# Patient Record
Sex: Female | Born: 1995 | Race: Black or African American | Hispanic: No | Marital: Single | Smoking: Former smoker
Health system: Southern US, Community
[De-identification: ages and names within clinical notes are randomized; demographics above are authoritative.]

## PROBLEM LIST (undated history)

## (undated) DIAGNOSIS — F32A Depression, unspecified: Secondary | ICD-10-CM

## (undated) DIAGNOSIS — F419 Anxiety disorder, unspecified: Secondary | ICD-10-CM

## (undated) DIAGNOSIS — F319 Bipolar disorder, unspecified: Secondary | ICD-10-CM

## (undated) DIAGNOSIS — E282 Polycystic ovarian syndrome: Secondary | ICD-10-CM

## (undated) DIAGNOSIS — F329 Major depressive disorder, single episode, unspecified: Secondary | ICD-10-CM

## (undated) DIAGNOSIS — L02419 Cutaneous abscess of limb, unspecified: Secondary | ICD-10-CM

---
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
## 2015-03-07 ENCOUNTER — Encounter (HOSPITAL_COMMUNITY): Payer: Self-pay

## 2015-03-07 ENCOUNTER — Emergency Department (HOSPITAL_COMMUNITY)
Admission: EM | Admit: 2015-03-07 | Discharge: 2015-03-07 | Disposition: A | Discharge status: Home / Self Care | Payer: Managed Care, Other (non HMO) | Attending: Emergency Medicine | Admitting: Emergency Medicine

## 2015-03-07 DIAGNOSIS — Z792 Long term (current) use of antibiotics: Secondary | ICD-10-CM | POA: Insufficient documentation

## 2015-03-07 DIAGNOSIS — J069 Acute upper respiratory infection, unspecified: Secondary | ICD-10-CM | POA: Diagnosis not present

## 2015-03-07 DIAGNOSIS — R0981 Nasal congestion: Secondary | ICD-10-CM | POA: Diagnosis present

## 2015-03-07 MED ORDER — IPRATROPIUM-ALBUTEROL 0.5-2.5 (3) MG/3ML IN SOLN
3.0000 mL | Freq: Once | RESPIRATORY_TRACT | Status: AC
  Administered 2015-03-07: 3 mL via RESPIRATORY_TRACT
  Filled 2015-03-07: qty 3

## 2015-03-07 MED ORDER — ALBUTEROL SULFATE HFA 108 (90 BASE) MCG/ACT IN AERS: 1.0000 | INHALATION_SPRAY | Freq: Four times a day (QID) | RESPIRATORY_TRACT | Status: DC | PRN

## 2015-03-07 NOTE — ED Notes (Signed)
Patient here with cough, congestion, runny nose x 1 week. No distress

## 2015-03-07 NOTE — ED Provider Notes (Signed)
CSN: [id number]     Arrival date & time 03/07/15  1411 History  By signing my name below, I, Ronney Lion, attest that this documentation has been prepared under the direction and in the presence of [facility], PA-C. Electronically Signed: Ronney Lion, ED Scribe. 03/07/2015. 2:54 PM.    Chief Complaint  Patient presents with  . Cough  . Nasal Congestion   The history is provided by the patient. No language interpreter was used.   HPI Comments: Veronica Moyer is a 20 y.o. female who presents to the Emergency Department with multiple URI-like complaints, including a constant, moderate, worsening cough, congestion, and rhinorrhea that began 5 days ago. She also notes associated voice hoarseness and subjective fever. She denies a history of asthma or any other pulmonary conditions. Patient had taken OTC flu medication with no relief.   Past Medical History  Diagnosis Date  . Medical history non-contributory    Past Surgical History  Procedure Laterality Date  . No past surgeries     No family history on file. Social History  Substance Use Topics  . Smoking status: Never Smoker   . Smokeless tobacco: None  . Alcohol Use: No   OB History    Gravida Para Term Preterm AB TAB SAB Ectopic Multiple Living   1    1          Review of Systems  Constitutional: Positive for fever (subjective).  HENT: Positive for congestion, rhinorrhea and voice change.   Respiratory: Positive for cough.    Allergies  Review of patient's allergies indicates no known allergies.  Home Medications   Prior to Admission medications   Medication Sig Start Date End Date Taking? Authorizing Provider  albuterol (PROVENTIL HFA;VENTOLIN HFA) 108 (90 Base) MCG/ACT inhaler Inhale 1-2 puffs into the lungs every 6 (six) hours as needed for wheezing or shortness of breath. 03/07/15   Chase Picket Ward, PA-C  cephALEXin (KEFLEX) 500 MG capsule Take 1 capsule (500 mg total) by mouth 4 (four) times daily. 11/16/14   Felicie Morn, NP  HYDROcodone-acetaminophen (NORCO/VICODIN) 5-325 MG tablet Take 1 tablet by mouth every 6 (six) hours as needed for severe pain. 11/16/14   Felicie Morn, NP  ibuprofen (ADVIL,MOTRIN) 400 MG tablet Take 400 mg by mouth every 6 (six) hours as needed (pain).     Historical Provider, MD   BP 126/61 mmHg  Pulse 103  Temp(Src) 98.8 F (37.1 C) (Oral)  Resp 18  Ht  (1.549 m)  Wt 104.327 kg  BMI 43.48 kg/m2  SpO2 98%  LMP 12/01/2014 Physical Exam  Constitutional: She is oriented to person, place, and time. She appears well-developed and well-nourished. No distress.  Speaking in full sentences.   HENT:  Head: Normocephalic and atraumatic.  Mouth/Throat: Oropharynx is clear and moist. No oropharyngeal exudate.  Neck: Normal range of motion. Neck supple. No tracheal deviation present.  Cardiovascular: Normal rate, regular rhythm and normal heart sounds.   No murmur heard. Pulmonary/Chest: Effort normal. No respiratory distress. She has wheezes (Diffuse expiratory).  Abdominal: Soft. She exhibits no distension. There is no tenderness.  Musculoskeletal: Normal range of motion. She exhibits no edema.  Lymphadenopathy:    She has no cervical adenopathy.  Neurological: She is alert and oriented to person, place, and time.  Skin: Skin is warm and dry. No rash noted.  Psychiatric: She has a normal mood and affect. Her behavior is normal.  Nursing note and vitals reviewed.   ED Course  Procedures (including critical care time)  DIAGNOSTIC STUDIES: Oxygen Saturation is 98% on RA, normal by my interpretation.    COORDINATION OF CARE: 2:49 PM - Discussed treatment plan with pt at bedside which includes nebulizer treatment administered here, followed by re-check. Pt verbalized understanding and agreed to plan.   Imaging Review No results found. I have personally reviewed and evaluated these images and lab results as part of my medical decision-making.  MDM   Final diagnoses:   URI (upper respiratory infection)   Veronica Moyer is afebrile, non-toxic appearing with faint expiratory wheezing on exam. No crackles. Duoneb given in ED - patient states noticeable improvement and repeat lung exam with no wheezing. Will give albuterol inhaler. Patient is agreeable to symptomatic treatment for URI symptoms with  close follow up with PCP as needed (resource guide given).  spoke at length about emergent, changing, or worsening of symptoms that should prompt return to ER. Patient voices understanding and is agreeable to plan.   Blood pressure 126/61, pulse 103, temperature 98.8 F (37.1 C), temperature source Oral, resp. rate 18, height  (1.549 m), weight 104.327 kg, last menstrual period 12/01/2014, SpO2 98 %.  I personally performed the services described in this documentation, which was scribed in my presence. The recorded information has been reviewed and is accurate.    [facility] Ward, PA-C 03/07/15 1625  Benjiman Core, MD 03/08/15 [phone]

## 2015-03-07 NOTE — ED Notes (Signed)
Declined W/C at D/C and was escorted to lobby by RN. 

## 2015-03-07 NOTE — Discharge Instructions (Signed)
1. Medications: flonase as needed for nasal congestion, inhaler as needed for shortness of breath 2. Treatment: rest, drink plenty of fluids, take tylenol or ibuprofen for fever control if needed 3. Follow Up: Please follow up with your primary doctor in 3 days for discussion of your diagnoses and further evaluation after today's visit;  if you do not have a primary care doctor use the resource guide provided to find one; Return to the ER for high fevers, difficulty breathing or other concerning symptoms   Emergency Department Resource Guide  1) Find a Doctor and Pay Out of Pocket Although you won't have to find out who is covered by your insurance plan, it is a good idea to ask around and get recommendations. You will then need to call the office and see if the doctor you have chosen will accept you as a new patient and what types of options they offer for patients who are self-pay. Some doctors offer discounts or will set up payment plans for their patients who do not have insurance, but you will need to ask so you aren't surprised when you get to your appointment.  2) Contact Your Local Health Department Not all health departments have doctors that can see patients for sick visits, but many do, so it is worth a call to see if yours does. If you don't know where your local health department is, you can check in your phone book. The CDC also has a tool to help you locate your state's health department, and many state websites also have listings of all of their local health departments.  3) Find a Walk-in Clinic If your illness is not likely to be very severe or complicated, you may want to try a walk in clinic. These are popping up all over the country in pharmacies, drugstores, and shopping centers. They're usually staffed by nurse practitioners or physician assistants that have been trained to treat common illnesses and complaints. They're usually fairly quick and inexpensive. However, if you have  serious medical issues or chronic medical problems, these are probably not your best option.  No Primary Care Doctor: - Call Health Connect at  [phone]: they can help you locate a primary care doctor that  accepts your insurance, provides certain services, etc. - Physician Referral Service: [phone]  Chronic Pain Problems: Organization         Address  Phone   Notes  [facility]  [phone] Patients need to be referred by their primary care doctor.   Medication Assistance: Organization         Address  Phone   Notes  [facility] Medication [facility] [address] [place], [place] [address] [phone] --Must be a resident of [facility] -- Must have NO insurance coverage whatsoever (no Medicaid/ Medicare, etc.) -- The pt. MUST have a primary care doctor that directs their care regularly and follows them in the community   [facility]  [phone]   [employer]  [phone]    Agencies that provide inexpensive medical care: Organization         Address  Phone   Notes  [facility]  [phone]   [facility]    [phone]   [facility] [address] [place], [place] [address] [phone]   [facility] [address] [place] [phone]   [facility]    [phone]  [facility]    [phone]   [facility]  [address], [place] Phone:  [phone], Fax:  [phone] Hours of Operation:  9 am - 6 pm, M-F.  Also accepts Medicaid/Medicare and self-pay.  [facility]  [address], [place] Phone: [phone], Fax: [phone]. Hours of Operation:  8:30 am - 5:30 pm, M-F.  Also accepts Medicaid and self-pay.  [facility] [place] [address] [place] Phone: [phone]   [facility] [address] Natasha Bence [place], [place] [phone], Ext. [phone] Mondays & Thursdays: 7-9 AM.  First 15 patients are seen on a first come, first serve basis.    Medicaid-accepting [facility] Providers:  Organization         Address  Phone   Notes  [facility] [address], [place] [phone] Also accepts self-pay patients.  [facility] [address] Laurell Josephs [place], [place]  [phone]   [facility] [address], [place] [phone]   [facility] [address] [place] [phone]   Renaye Rakers [address], [place]   [phone] Only accepts [place] patients after they have their name applied to their card.   Self-Pay (no insurance) in [facility]:  Organization         Address  Phone   Notes  Sickle Cell Patients, [facility] [address] [place], [place] [phone]   [facility] [address] [place], [place] [phone]   [facility]  [address], [place] [phone]   [facility]/Dr. Osei-Bonsu  [address] [place] or [address], [place] [phone] Phone number for both [place] and [place] locations is the same.  [facility] and [facility] [address], [place] [phone]   [facility] [address], [place] or [address] [phone]   [facility] [address] [place] [phone], phone; [phone], fax Sees patients 1st and 3rd Saturday of every month.  Must not qualify for public or private insurance (i.e. Medicaid, Medicare, [place] Health Choice, Veterans' Benefits)  Household income should be no more than 200% of the poverty level The clinic cannot treat you if you are pregnant or think you are pregnant   Sexually transmitted diseases are not treated at the clinic.

## 2015-06-23 ENCOUNTER — Encounter (HOSPITAL_COMMUNITY): Payer: Self-pay | Admitting: Emergency Medicine

## 2015-06-23 ENCOUNTER — Ambulatory Visit (HOSPITAL_COMMUNITY)
Admission: EM | Admit: 2015-06-23 | Discharge: 2015-06-23 | Disposition: A | Discharge status: Home / Self Care | Payer: Managed Care, Other (non HMO) | Attending: Emergency Medicine | Admitting: Emergency Medicine

## 2015-06-23 DIAGNOSIS — H109 Unspecified conjunctivitis: Secondary | ICD-10-CM

## 2015-06-23 DIAGNOSIS — L732 Hidradenitis suppurativa: Secondary | ICD-10-CM

## 2015-06-23 MED ORDER — CLINDAMYCIN HCL 300 MG PO CAPS: 300.0000 mg | ORAL_CAPSULE | Freq: Four times a day (QID) | ORAL | Status: DC

## 2015-06-23 MED ORDER — POLYMYXIN B-TRIMETHOPRIM 10000-0.1 UNIT/ML-% OP SOLN: 2.0000 [drp] | OPHTHALMIC | Status: DC

## 2015-06-23 NOTE — ED Notes (Signed)
Pt here for left pink eye onset this am Also reports abscess on left flank and would like antibiotics  A&O x4... No acute distress.

## 2015-06-23 NOTE — Discharge Instructions (Signed)
Hidradenitis Suppurativa Hidradenitis suppurativa is a long-term (chronic) skin disease that starts with blocked sweat glands or hair follicles. Bacteria may grow in these blocked openings of your skin. Hidradenitis suppurativa is like a severe form of acne that develops in areas of your body where acne would be unusual. It is most likely to affect the areas of your body where skin rubs against skin and becomes moist. This includes your:  Underarms.  Groin.  Genital areas.  Buttocks.  Upper thighs.  Breasts. Hidradenitis suppurativa may start out with small pimples. The pimples can develop into deep sores that break open (rupture) and drain pus. Over time your skin may thicken and become scarred. Hidradenitis suppurativa cannot be passed from person to person.  CAUSES  The exact cause of hidradenitis suppurativa is not known. This condition may be due to:  Female and female hormones. The condition is rare before and after puberty.  An overactive body defense system (immune system). Your immune system may overreact to the blocked hair follicles or sweat glands and cause swelling and pus-filled sores. RISK FACTORS You may have a higher risk of hidradenitis suppurativa if you:  Are a woman.  Are between ages 11 and 9.  Have a family history of hidradenitis suppurativa.  Have a personal history of acne.  Are overweight.  Smoke.  Take the drug lithium. SIGNS AND SYMPTOMS  The first signs of an outbreak are usually painful skin bumps that look like pimples. As the condition progresses:  Skin bumps may get bigger and grow deeper into the skin.  Bumps under the skin may rupture and drain smelly pus.  Skin may become itchy and infected.  Skin may thicken and scar.  Drainage may continue through tunnels under the skin (fistulas).  Walking and moving your arms can become painful. DIAGNOSIS  Your health care provider may diagnose hidradenitis suppurativa based on your medical  history and your signs and symptoms. A physical exam will also be done. You may need to see a health care provider who specializes in skin diseases (dermatologist). You may also have tests done to confirm the diagnosis. These can include:  Swabbing a sample of pus or drainage from your skin so it can be sent to the lab and tested for infection.  Blood tests to check for infection. TREATMENT  The same treatment will not work for everybody with hidradenitis suppurativa. Your treatment will depend on how severe your symptoms are. You may need to try several treatments to find what works best for you. Part of your treatment may include cleaning and bandaging (dressing) your wounds. You may also have to take medicines, such as the following:  Antibiotics.  Acne medicines.  Medicines to block or suppress the immune system.  A diabetes medicine (metformin) is sometimes used to treat this condition.  For women, birth control pills can sometimes help relieve symptoms. You may need surgery if you have a severe case of hidradenitis suppurativa that does not respond to medicine. Surgery may involve:   Using a laser to clear the skin and remove hair follicles.  Opening and draining deep sores.  Removing the areas of skin that are diseased and scarred. HOME CARE INSTRUCTIONS  Learn as much as you can about your disease, and work closely with your health care providers.  Take medicines only as directed by your health care provider.  If you were prescribed an antibiotic medicine, finish it all even if you start to feel better.  If you are  overweight, losing weight may be very helpful. Try to reach and maintain a healthy weight.  Do not use any tobacco products, including cigarettes, chewing tobacco, or electronic cigarettes. If you need help quitting, ask your health care provider.  Do not shave the areas where you get hidradenitis suppurativa.  Do not wear deodorant.  Wear loose-fitting  clothes.  Try not to overheat and get sweaty.  Take a daily bleach bath as directed by your health care provider.  Fill your bathtub halfway with water.  Pour in  cup of unscented household bleach.  Soak for 5-10 minutes.  Cover sore areas with a warm, clean washcloth (compress) for 5-10 minutes. SEEK MEDICAL CARE IF:   You have a flare-up of hidradenitis suppurativa.  You have chills or a fever.  You are having trouble controlling your symptoms at home.   This information is not intended to replace advice given to you by your health care provider. Make sure you discuss any questions you have with your health care provider.   Document Released: 08/16/2003 Document Revised: 01/22/2014 Document Reviewed: 04/03/2013 Elsevier Interactive Patient Education 2016 Elsevier Inc.  Bacterial Conjunctivitis Bacterial conjunctivitis (commonly called pink eye) is redness, soreness, or puffiness (inflammation) of the white part of your eye. It is caused by a germ called bacteria. These germs can easily spread from person to person (contagious). Your eye often will become red or pink. Your eye may also become irritated, watery, or have a thick discharge.  HOME CARE   Apply a cool, clean washcloth over closed eyelids. Do this for 10-20 minutes, 3-4 times a day while you have pain.  Gently wipe away any fluid coming from the eye with a warm, wet washcloth or cotton ball.  Wash your hands often with soap and water. Use paper towels to dry your hands.  Do not share towels or washcloths.  Change or wash your pillowcase every day.  Do not use eye makeup until the infection is gone.  Do not use machines or drive if your vision is blurry.  Stop using contact lenses. Do not use them again until your doctor says it is okay.  Do not touch the tip of the eye drop bottle or medicine tube with your fingers when you put medicine on the eye. GET HELP RIGHT AWAY IF:   Your eye is not better after 3  days of starting your medicine.  You have a yellowish fluid coming out of the eye.  You have more pain in the eye.  Your eye redness is spreading.  Your vision becomes blurry.  You have a fever or lasting symptoms for more than 2-3 days.  You have a fever and your symptoms suddenly get worse.  You have pain in the face.  Your face gets red or puffy (swollen). MAKE SURE YOU:   Understand these instructions.  Will watch this condition.  Will get help right away if you are not doing well or get worse.   This information is not intended to replace advice given to you by your health care provider. Make sure you discuss any questions you have with your health care provider.   Document Released: 10/11/2007 Document Revised: 12/19/2011 Document Reviewed: 09/07/2011 Elsevier Interactive Patient Education [employer] Elsevier Inc.

## 2015-06-23 NOTE — ED Provider Notes (Signed)
CSN: [id number]     Arrival date & time 06/23/15  1302 History   First MD Initiated Contact with Patient 06/23/15 1335     Chief Complaint  Patient presents with  . Conjunctivitis  . 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 She has a history of hydradenitis and that she has a flare at this time. She states that in the past her abscesses will usually self rupture on occasion she has had to have incision and drainage Other symptoms include:   Pain score: 3 FAMILY HISTORY: Family history of hypertension    Past Medical History  Diagnosis Date  . Medical history non-contributory    Past Surgical History  Procedure Laterality Date  . No past surgeries     No family history on file. Social History  Substance Use Topics  . Smoking status: Never Smoker   . Smokeless tobacco: None  . Alcohol Use: No   OB History    Gravida Para Term Preterm AB TAB SAB Ectopic Multiple Living   1    1          Review of Systems  Denies: HEADACHE, NAUSEA, ABDOMINAL PAIN, CHEST PAIN, CONGESTION, DYSURIA, SHORTNESS OF BREATH  Allergies  Review of patient's allergies indicates no known allergies.  Home Medications   Prior to Admission medications   Medication Sig Start Date End Date Taking? Authorizing Provider  albuterol (PROVENTIL HFA;VENTOLIN HFA) 108 (90 Base) MCG/ACT inhaler Inhale 1-2 puffs into the lungs every 6 (six) hours as needed for wheezing or shortness of breath. 03/07/15   Chase Picket Ward, PA-C  cephALEXin (KEFLEX) 500 MG capsule Take 1 capsule (500 mg total) by mouth 4 (four) times  daily. 11/16/14   Felicie Morn, NP  clindamycin (CLEOCIN) 300 MG capsule Take 1 capsule (300 mg total) by mouth every 6 (six) hours. 06/23/15   Tharon Aquas, PA  HYDROcodone-acetaminophen (NORCO/VICODIN) 5-325 MG tablet Take 1 tablet by mouth every 6 (six) hours as needed for severe pain. 11/16/14   Felicie Morn, NP  ibuprofen (ADVIL,MOTRIN) 400 MG tablet Take 400 mg by mouth every 6 (six) hours as needed (pain).     Historical Provider, MD  trimethoprim-polymyxin b (POLYTRIM) ophthalmic solution Place 2 drops into the left eye every 4 (four) hours. 06/23/15   Tharon Aquas, PA   Meds Ordered and Administered this Visit  Medications - No data to display  BP 122/88 mmHg  Pulse 104  Temp(Src) 98.1 F (36.7 C) (Oral)  Resp 18  SpO2 96% No data found.   Physical Exam NURSES NOTES AND VITAL SIGNS REVIEWED. CONSTITUTIONAL: Well developed, well nourished, no acute distress HEENT: normocephalic, atraumatic EYES:  RightConjunctiva normal, Left conjunctiva injected with follicular swelling.  NECK:normal ROM, supple, no adenopathy PULMONARY:No respiratory distress, normal effort ABDOMINAL: Soft, ND, NT BS+, No CVAT, there is a tender 2 cm fluctuant mass lesion left side. No cellulitis.  MUSCULOSKELETAL: Normal ROM of all extremities,  SKIN: warm and dry without rash PSYCHIATRIC: Mood and affect, behavior are normal  ED Course  Procedures (including critical care time)  Labs Review Labs Reviewed - No data to display  Imaging Review No results  found.   Visual Acuity Review  Right Eye Distance:   Left Eye Distance:   Bilateral Distance:    Right Eye Near:   Left Eye Near:    Bilateral Near:    Discussed treatment of abscess 4 with incision and drainage patient is declining states that she would prefer just having antibiotics rx for polytrim, and clindamycin HS will get better but is a chronic problem so most likely will come back in the future, BC is a self limited illness and full  recovery is prognosed.   MDM   1. Hidradenitis suppurativa   2. Conjunctivitis of left eye     Patient is reassured that there are no issues that require transfer to higher level of care at this time or additional tests. Patient is advised to continue home symptomatic treatment. Patient is advised that if there are new or worsening symptoms to attend the emergency department, contact primary care provider, or return to UC. Instructions of care provided discharged home in stable condition.    THIS NOTE WAS GENERATED USING A VOICE RECOGNITION SOFTWARE PROGRAM. ALL REASONABLE EFFORTS  WERE MADE TO PROOFREAD THIS DOCUMENT FOR ACCURACY.  I have verbally reviewed the discharge instructions with the patient. A printed AVS was given to the patient.  All questions were answered prior to discharge.      Tharon AquasFrank C Fawn Desrocher, PA 06/23/15 1407

## 2015-06-23 NOTE — ED Notes (Signed)
Pt d/c by frank p, pa 

## 2015-12-05 ENCOUNTER — Emergency Department (HOSPITAL_COMMUNITY): Payer: Managed Care, Other (non HMO)

## 2015-12-05 ENCOUNTER — Encounter (HOSPITAL_COMMUNITY): Payer: Self-pay | Admitting: Vascular Surgery

## 2015-12-05 ENCOUNTER — Emergency Department (HOSPITAL_COMMUNITY)
Admission: EM | Admit: 2015-12-05 | Discharge: 2015-12-05 | Disposition: A | Discharge status: Home / Self Care | Payer: Managed Care, Other (non HMO) | Attending: Emergency Medicine | Admitting: Emergency Medicine

## 2015-12-05 DIAGNOSIS — R05 Cough: Secondary | ICD-10-CM | POA: Diagnosis present

## 2015-12-05 DIAGNOSIS — B9789 Other viral agents as the cause of diseases classified elsewhere: Secondary | ICD-10-CM

## 2015-12-05 DIAGNOSIS — J069 Acute upper respiratory infection, unspecified: Secondary | ICD-10-CM | POA: Insufficient documentation

## 2015-12-05 MED ORDER — BENZONATATE 100 MG PO CAPS: 200.0000 mg | ORAL_CAPSULE | Freq: Two times a day (BID) | ORAL | 0 refills | Status: DC | PRN

## 2015-12-05 MED ORDER — OXYMETAZOLINE HCL 0.05 % NA SOLN: 1.0000 | Freq: Two times a day (BID) | NASAL | 0 refills | Status: DC

## 2015-12-05 NOTE — ED Notes (Signed)
D/C papers reviewed and patient verbalizes understanding   

## 2015-12-05 NOTE — Discharge Instructions (Signed)
Take your medications as prescribed. I also recommend continuing to drink fluids at home to remain hydrated. You may also use a cool mist humidifier at home to help with her cough. Please follow up with a primary care provider from the Resource Guide provided below in one week if your symptoms are not improved. Please return to the Emergency Department if symptoms worsen or new onset of fever, headache, neck stiffness, coughing up blood, wheezing, difficulty breathing, chest pain, abdominal pain, vomiting, unable to keep fluids down.

## 2015-12-05 NOTE — ED Provider Notes (Signed)
[facility]PT Provider Note   CSN: [id number] Arrival date & time: 12/05/15  1314  By signing my name below, I, Emmanuella Mensah, attest that this documentation has been prepared under the direction and in the presence of Melburn Hake, PA-C. Electronically Signed: Angelene Giovanni, ED Scribe. 12/05/15. 1:48 PM.   History   Chief Complaint Chief Complaint  Patient presents with  . Cough    HPI Comments: Veronica Moyer is a 20 y.o. female who presents to the Emergency Department complaining of gradually worsening persistent moderate productive cough with cloudy green/gray sputum onset 3 days ago. She notes that she had one episode of small amount of bright red streaky bloody sputum with her cough 2 days ago. She reports associated chills, nasal congestion, ear drainage, and intermittent episodes of shortness of breath which have since improved. She adds that she has been experiencing chest wall tenderness after coughing as well. No alleviating factors noted. Pt states that she has tried OTC medications PTA with no relief. She states that she worked with someone who had pneumonia several days prior to onset of her symptoms. Pt works at a [employer]. Pt has not received her flu vaccine this season. She also denies a hx of asthma. She denies any fever, sore throat, wheezing, vomiting, or any other symptoms.   The history is provided by the patient. No language interpreter was used.    Past Medical History:  Diagnosis Date  . Medical history non-contributory     There are no active problems to display for this patient.   Past Surgical History:  Procedure Laterality Date  . NO PAST SURGERIES      OB History    Gravida Para Term Preterm AB Living   1       1     SAB TAB Ectopic Multiple Live Births                   Home Medications    Prior to Admission medications   Medication Sig Start Date End Date Taking? Authorizing Provider  albuterol (PROVENTIL  HFA;VENTOLIN HFA) 108 (90 Base) MCG/ACT inhaler Inhale 1-2 puffs into the lungs every 6 (six) hours as needed for wheezing or shortness of breath. 03/07/15   Chase Picket Ward, PA-C  benzonatate (TESSALON) 100 MG capsule Take 2 capsules (200 mg total) by mouth 2 (two) times daily as needed for cough. 12/05/15   Barrett Henle, PA-C  cephALEXin (KEFLEX) 500 MG capsule Take 1 capsule (500 mg total) by mouth 4 (four) times daily. 11/16/14   Felicie Morn, NP  clindamycin (CLEOCIN) 300 MG capsule Take 1 capsule (300 mg total) by mouth every 6 (six) hours. 06/23/15   Tharon Aquas, PA  HYDROcodone-acetaminophen (NORCO/VICODIN) 5-325 MG tablet Take 1 tablet by mouth every 6 (six) hours as needed for severe pain. 11/16/14   Felicie Morn, NP  ibuprofen (ADVIL,MOTRIN) 400 MG tablet Take 400 mg by mouth every 6 (six) hours as needed (pain).     Historical Provider, MD  oxymetazoline (AFRIN NASAL SPRAY) 0.05 % nasal spray Place 1 spray into both nostrils 2 (two) times daily. Spray once into each nostril twice daily for up to the next 3 days. Do not use for more than 3 days to prevent rebound rhinorrhea. 12/05/15   Barrett Henle, PA-C  trimethoprim-polymyxin b (POLYTRIM) ophthalmic solution Place 2 drops into the left eye every 4 (four) hours. 06/23/15   Tharon Aquas, PA    Family  History History reviewed. No pertinent family history.  Social History Social History  Substance Use Topics  . Smoking status: Never Smoker  . Smokeless tobacco: Never Used  . Alcohol use No     Allergies   Patient has no known allergies.   Review of Systems Review of Systems  Constitutional: Positive for chills. Negative for fever.  HENT: Positive for congestion and ear discharge. Negative for sore throat.   Respiratory: Positive for cough and shortness of breath. Negative for wheezing.   Gastrointestinal: Negative for vomiting.     Physical Exam Updated Vital Signs BP 143/92 (BP Location: Left Arm)    Pulse 103   Temp 98.4 F (36.9 C) (Oral)   Resp 18   SpO2 100%   Physical Exam  Constitutional: She is oriented to person, place, and time. She appears well-developed and well-nourished.  HENT:  Head: Normocephalic and atraumatic.  Right Ear: A middle ear effusion is present.  Left Ear: A middle ear effusion is present.  Nose: Rhinorrhea present.  Mouth/Throat: Uvula is midline, oropharynx is clear and moist and mucous membranes are normal. No oropharyngeal exudate, posterior oropharyngeal edema, posterior oropharyngeal erythema or tonsillar abscesses. No tonsillar exudate.  Eyes: Conjunctivae and EOM are normal. Right eye exhibits no discharge. Left eye exhibits no discharge. No scleral icterus.  Neck: Normal range of motion. Neck supple.  Cardiovascular: Normal rate, regular rhythm, normal heart sounds and intact distal pulses.   HR 92  Pulmonary/Chest: Effort normal and breath sounds normal. No respiratory distress. She has no wheezes. She has no rales. She exhibits no tenderness.  Abdominal: Soft. Bowel sounds are normal. There is no tenderness.  Musculoskeletal: Normal range of motion. She exhibits no edema.  Lymphadenopathy:    She has no cervical adenopathy.  Neurological: She is alert and oriented to person, place, and time.  Skin: Skin is warm and dry.  Nursing note and vitals reviewed.    ED Treatments / Results  DIAGNOSTIC STUDIES: Oxygen Saturation is 100% on RA, normal by my interpretation.    COORDINATION OF CARE: 1:46 PM- Pt advised of plan for treatment and pt agrees. Pt will receive chest x-ray for further evaluation.    Labs (all labs ordered are listed, but only abnormal results are displayed) Labs Reviewed - No data to display  EKG  EKG Interpretation None       Radiology Dg Chest 2 View  Result Date: 12/05/2015 CLINICAL DATA:  Cough for 2 days. EXAM: CHEST  2 VIEW COMPARISON:  None. FINDINGS: The heart size and mediastinal contours are within  normal limits. Both lungs are clear. The visualized skeletal structures are unremarkable. IMPRESSION: Normal chest. Electronically Signed   By: Francene BoyersJames  Maxwell M.D.   On: 12/05/2015 14:40    Procedures Procedures (including critical care time)  Medications Ordered in ED Medications - No data to display   Initial Impression / Assessment and Plan / ED Course  Melburn HakeNicole Nikea Settle, PA-C has reviewed the triage vital signs and the nursing notes.  Pertinent labs & imaging results that were available during my care of the patient were reviewed by me and considered in my medical decision making (see chart for details).  Clinical Course     Pt CXR negative for acute infiltrate. Patients symptoms are consistent with URI, likely viral etiology. Discussed that antibiotics are not indicated for viral infections. Pt will be discharged with symptomatic treatment.  Verbalizes understanding and is agreeable with plan. Pt is hemodynamically stable & in NAD  prior to dc. Discussed return precautions.  Final Clinical Impressions(s) / ED Diagnoses   Final diagnoses:  Viral URI with cough    New Prescriptions Current Discharge Medication List    START taking these medications   Details  benzonatate (TESSALON) 100 MG capsule Take 2 capsules (200 mg total) by mouth 2 (two) times daily as needed for cough. Qty: 20 capsule, Refills: 0    oxymetazoline (AFRIN NASAL SPRAY) 0.05 % nasal spray Place 1 spray into both nostrils 2 (two) times daily. Spray once into each nostril twice daily for up to the next 3 days. Do not use for more than 3 days to prevent rebound rhinorrhea. Qty: 30 mL, Refills: 0       I personally performed the services described in this documentation, which was scribed in my presence. The recorded information has been reviewed and is accurate.    Satira Sarkicole Elizabeth LedyardNadeau, [place] JerseyPA-C 12/05/15 1451    Eber HongBrian Miller, MD 12/05/15 1719

## 2015-12-05 NOTE — ED Triage Notes (Signed)
Pt reports to the ED for eval of productive green/gray cough since Friday. She reports she has been having some chills. Pt denies any N/V. She denies a sore throat just states her throat hurts when she coughs. She has tried OTC txs without relief. Pt A&Ox4, resp e/u, and skin warm and dry.

## 2016-02-06 ENCOUNTER — Encounter (HOSPITAL_COMMUNITY): Payer: Self-pay | Admitting: Family Medicine

## 2016-02-06 ENCOUNTER — Ambulatory Visit (HOSPITAL_COMMUNITY)
Admission: EM | Admit: 2016-02-06 | Discharge: 2016-02-06 | Disposition: A | Discharge status: Home / Self Care | Payer: Managed Care, Other (non HMO) | Attending: Family Medicine | Admitting: Family Medicine

## 2016-02-06 DIAGNOSIS — Z202 Contact with and (suspected) exposure to infections with a predominantly sexual mode of transmission: Secondary | ICD-10-CM

## 2016-02-06 DIAGNOSIS — H6501 Acute serous otitis media, right ear: Secondary | ICD-10-CM | POA: Diagnosis not present

## 2016-02-06 MED ORDER — IPRATROPIUM BROMIDE 0.06 % NA SOLN: 2.0000 | Freq: Four times a day (QID) | NASAL | 0 refills | Status: DC

## 2016-02-06 MED ORDER — AMOXICILLIN 875 MG PO TABS: 875.0000 mg | ORAL_TABLET | Freq: Two times a day (BID) | ORAL | 0 refills | Status: DC

## 2016-02-06 NOTE — ED Provider Notes (Signed)
CSN: [id number]     Arrival date & time 02/06/16  1526 History   First MD Initiated Contact with Patient 02/06/16 1643     Chief Complaint  Patient presents with  . URI   (Consider location/radiation/quality/duration/timing/severity/associated sxs/prior Treatment) Patient c/o URI sx's for over a week.  Patient request STD testing.  Patient denies sx's.   The history is provided by the patient.  URI  Presenting symptoms: congestion, cough and fatigue   Severity:  Moderate Onset quality:  Sudden Duration:  1 week Timing:  Constant Progression:  Worsening Chronicity:  New Relieved by:  Nothing Worsened by:  Nothing Ineffective treatments:  None tried   Past Medical History:  Diagnosis Date  . Medical history non-contributory    Past Surgical History:  Procedure Laterality Date  . NO PAST SURGERIES     History reviewed. No pertinent family history. Social History  Substance Use Topics  . Smoking status: Never Smoker  . Smokeless tobacco: Never Used  . Alcohol use No   OB History    Gravida Para Term Preterm AB Living   1       1     SAB TAB Ectopic Multiple Live Births                 Review of Systems  Constitutional: Positive for fatigue.  HENT: Positive for congestion.   Eyes: Negative.   Respiratory: Positive for cough.   Cardiovascular: Negative.   Gastrointestinal: Negative.   Endocrine: Negative.   Genitourinary: Negative.   Musculoskeletal: Negative.   Allergic/Immunologic: Negative.   Neurological: Negative.   Hematological: Negative.   Psychiatric/Behavioral: Negative.     Allergies  Patient has no known allergies.  Home Medications   Prior to Admission medications   Medication Sig Start Date End Date Taking? Authorizing Provider  albuterol (PROVENTIL HFA;VENTOLIN HFA) 108 (90 Base) MCG/ACT inhaler Inhale 1-2 puffs into the lungs every 6 (six) hours as needed for wheezing or shortness of breath. 03/07/15   Chase PicketJaime Pilcher Ward, PA-C   amoxicillin (AMOXIL) 875 MG tablet Take 1 tablet (875 mg total) by mouth 2 (two) times daily. 02/06/16   Deatra CanterWilliam J Yousaf Sainato, FNP  benzonatate (TESSALON) 100 MG capsule Take 2 capsules (200 mg total) by mouth 2 (two) times daily as needed for cough. 12/05/15   Barrett HenleNicole Elizabeth Nadeau, PA-C  cephALEXin (KEFLEX) 500 MG capsule Take 1 capsule (500 mg total) by mouth 4 (four) times daily. 11/16/14   Felicie Mornavid Smith, NP  clindamycin (CLEOCIN) 300 MG capsule Take 1 capsule (300 mg total) by mouth every 6 (six) hours. 06/23/15   Tharon AquasFrank C Patrick, PA  HYDROcodone-acetaminophen (NORCO/VICODIN) 5-325 MG tablet Take 1 tablet by mouth every 6 (six) hours as needed for severe pain. 11/16/14   Felicie Mornavid Smith, NP  ibuprofen (ADVIL,MOTRIN) 400 MG tablet Take 400 mg by mouth every 6 (six) hours as needed (pain).     Historical Provider, MD  ipratropium (ATROVENT) 0.06 % nasal spray Place 2 sprays into both nostrils 4 (four) times daily. 02/06/16   Deatra CanterWilliam J Devone Tousley, FNP  oxymetazoline (AFRIN NASAL SPRAY) 0.05 % nasal spray Place 1 spray into both nostrils 2 (two) times daily. Spray once into each nostril twice daily for up to the next 3 days. Do not use for more than 3 days to prevent rebound rhinorrhea. 12/05/15   Barrett HenleNicole Elizabeth Nadeau, PA-C  trimethoprim-polymyxin b (POLYTRIM) ophthalmic solution Place 2 drops into the left eye every 4 (four) hours. 06/23/15   Homero FellersFrank  Baldwin Crown, PA   Meds Ordered and Administered this Visit  Medications - No data to display  BP 115/76   Pulse 93   Temp 98.8 F (37.1 C)   Resp 18   SpO2 100%  No data found.   Physical Exam  Constitutional: She appears well-developed and well-nourished.  HENT:  Head: Normocephalic and atraumatic.  Right Ear: External ear normal.  Left Ear: External ear normal.  Mouth/Throat: Oropharynx is clear and moist.  Eyes: Conjunctivae and EOM are normal. Pupils are equal, round, and reactive to light.  Neck: Normal range of motion. Neck supple.  Cardiovascular:  Normal rate, regular rhythm and normal heart sounds.   Pulmonary/Chest: Effort normal and breath sounds normal.  Abdominal: Soft. Bowel sounds are normal.  Nursing note and vitals reviewed.   Urgent Care Course     Procedures (including critical care time)  Labs Review Labs Reviewed  URINE CYTOLOGY ANCILLARY ONLY    Imaging Review No results found.   Visual Acuity Review  Right Eye Distance:   Left Eye Distance:   Bilateral Distance:    Right Eye Near:   Left Eye Near:    Bilateral Near:         MDM   1. Right acute serous otitis media, recurrence not specified   2. Possible exposure to STD    Amoxicillin 875mg  one po bid x 7 days #14 Tessalon Perles Ipratropium  Urine Cytology GC Chlamydia and trich      Deatra Canter, FNP 02/06/16 1835

## 2016-02-06 NOTE — ED Triage Notes (Signed)
Pt here for over week of URi symptoms.

## 2016-02-07 LAB — URINE CYTOLOGY ANCILLARY ONLY
Chlamydia: NEGATIVE
Neisseria Gonorrhea: NEGATIVE
Trichomonas: POSITIVE — AB

## 2016-02-08 ENCOUNTER — Telehealth (HOSPITAL_COMMUNITY): Payer: Self-pay | Admitting: Internal Medicine

## 2016-02-08 MED ORDER — METRONIDAZOLE 500 MG PO TABS: 2000.0000 mg | ORAL_TABLET | Freq: Once | ORAL | 0 refills | Status: AC

## 2016-02-08 NOTE — Telephone Encounter (Signed)
Clinical staff please let patient know that test for trichomonas was positive.  Will send rx for metronidazole to the pharmacy of record, [facility] on [address] [place].  Recheck for further evaluation as needed.  LM

## 2016-03-14 ENCOUNTER — Emergency Department (HOSPITAL_COMMUNITY)
Admission: EM | Admit: 2016-03-14 | Discharge: 2016-03-14 | Disposition: A | Discharge status: Home / Self Care | Payer: Managed Care, Other (non HMO) | Attending: Emergency Medicine | Admitting: Emergency Medicine

## 2016-03-14 ENCOUNTER — Encounter (HOSPITAL_COMMUNITY): Payer: Self-pay | Admitting: Emergency Medicine

## 2016-03-14 ENCOUNTER — Emergency Department (HOSPITAL_COMMUNITY): Payer: Managed Care, Other (non HMO)

## 2016-03-14 DIAGNOSIS — M25572 Pain in left ankle and joints of left foot: Secondary | ICD-10-CM | POA: Diagnosis not present

## 2016-03-14 DIAGNOSIS — Y9301 Activity, walking, marching and hiking: Secondary | ICD-10-CM | POA: Insufficient documentation

## 2016-03-14 DIAGNOSIS — Z79899 Other long term (current) drug therapy: Secondary | ICD-10-CM | POA: Diagnosis not present

## 2016-03-14 DIAGNOSIS — Y999 Unspecified external cause status: Secondary | ICD-10-CM | POA: Diagnosis not present

## 2016-03-14 DIAGNOSIS — X501XXA Overexertion from prolonged static or awkward postures, initial encounter: Secondary | ICD-10-CM | POA: Diagnosis not present

## 2016-03-14 DIAGNOSIS — Y929 Unspecified place or not applicable: Secondary | ICD-10-CM | POA: Insufficient documentation

## 2016-03-14 MED ORDER — NAPROXEN 250 MG PO TABS
500.0000 mg | ORAL_TABLET | Freq: Once | ORAL | Status: AC
Start: 2016-03-14 — End: 2016-03-14
  Administered 2016-03-14: 500 mg via ORAL
  Filled 2016-03-14: qty 2

## 2016-03-14 MED ORDER — NAPROXEN 500 MG PO TABS: 500.0000 mg | ORAL_TABLET | Freq: Two times a day (BID) | ORAL | 0 refills | Status: DC

## 2016-03-14 NOTE — Progress Notes (Signed)
Orthopedic Tech Progress Note Patient Details:  Veronica JensenKeyana Moyer 06-11-1995 [id number]  Ortho Devices Type of Ortho Device: Crutches Ortho Device/Splint Interventions: Ordered, Adjustment   Jennye MoccasinHughes, [name] 03/14/2016, 4:22 PM

## 2016-03-14 NOTE — ED Provider Notes (Signed)
[facility] Provider Note   By signing my name below, I, Veronica Moyer, attest that this documentation has been prepared under the direction and in the presence of Veronica Harp, PA-C. Electronically Signed: Earmon Moyer, ED Scribe. 03/14/16. 3:37 PM.    History   Chief Complaint Chief Complaint  Patient presents with  . Ankle Pain   The history is provided by the patient and medical records. No language interpreter was used.    Veronica Moyer is an obese 21 y.o. female who presents to the Emergency Department complaining of left ankle pain that began while walking down the stairs earlier today. She reports associated swelling. Pt states she inverted her ankle and felt a popping sensation. She rates her pain at 8/10 and describes it as constant pressure. She has not taken anything for pain relief. Moving the foot or bearing weight increases the pain. She denies alleviating factors. She denies bruising, wounds, numbness, tingling or weakness of the left foot or leg, fever, chills, nausea, vomiting.   Past Medical History:  Diagnosis Date  . Medical history non-contributory     There are no active problems to display for this patient.   Past Surgical History:  Procedure Laterality Date  . NO PAST SURGERIES      OB History    Gravida Para Term Preterm AB Living   1       1     SAB TAB Ectopic Multiple Live Births                   Home Medications    Prior to Admission medications   Medication Sig Start Date End Date Taking? Authorizing Provider  albuterol (PROVENTIL HFA;VENTOLIN HFA) 108 (90 Base) MCG/ACT inhaler Inhale 1-2 puffs into the lungs every 6 (six) hours as needed for wheezing or shortness of breath. 03/07/15   Chase Picket Ward, PA-C  amoxicillin (AMOXIL) 875 MG tablet Take 1 tablet (875 mg total) by mouth 2 (two) times daily. 02/06/16   Deatra Canter, FNP  benzonatate (TESSALON) 100 MG capsule Take 2 capsules (200 mg total) by mouth 2 (two)  times daily as needed for cough. 12/05/15   Barrett Henle, PA-C  cephALEXin (KEFLEX) 500 MG capsule Take 1 capsule (500 mg total) by mouth 4 (four) times daily. 11/16/14   Felicie Morn, NP  clindamycin (CLEOCIN) 300 MG capsule Take 1 capsule (300 mg total) by mouth every 6 (six) hours. 06/23/15   Tharon Aquas, PA  HYDROcodone-acetaminophen (NORCO/VICODIN) 5-325 MG tablet Take 1 tablet by mouth every 6 (six) hours as needed for severe pain. 11/16/14   Felicie Morn, NP  ibuprofen (ADVIL,MOTRIN) 400 MG tablet Take 400 mg by mouth every 6 (six) hours as needed (pain).     Historical Provider, MD  ipratropium (ATROVENT) 0.06 % nasal spray Place 2 sprays into both nostrils 4 (four) times daily. 02/06/16   Deatra Canter, FNP  naproxen (NAPROSYN) 500 MG tablet Take 1 tablet (500 mg total) by mouth 2 (two) times daily. 03/14/16   Aracelia Brinson Manuel Chipley, [place]  oxymetazoline (AFRIN NASAL SPRAY) 0.05 % nasal spray Place 1 spray into both nostrils 2 (two) times daily. Spray once into each nostril twice daily for up to the next 3 days. Do not use for more than 3 days to prevent rebound rhinorrhea. 12/05/15   Barrett Henle, PA-C  trimethoprim-polymyxin b (POLYTRIM) ophthalmic solution Place 2 drops into the left eye every 4 (four) hours. 06/23/15   Homero Fellers  Baldwin Crown, PA    Family History History reviewed. No pertinent family history.  Social History Social History  Substance Use Topics  . Smoking status: Never Smoker  . Smokeless tobacco: Never Used  . Alcohol use No     Allergies   Patient has no known allergies.   Review of Systems Review of Systems  Constitutional: Negative for chills and fever.  Gastrointestinal: Negative for nausea and vomiting.  Musculoskeletal: Positive for arthralgias and joint swelling.  Skin: Negative for color change and wound.  Neurological: Negative for weakness and numbness.     Physical Exam Updated Vital Signs BP 146/85 (BP Location: Left Arm)    Pulse 101   Temp 99.5 F (37.5 C) (Oral)   Resp 17   Ht 5\' 1"  (1.549 m)   Wt 250 lb (113.4 kg)   SpO2 99%   BMI 47.24 kg/m   Physical Exam  Constitutional: She is oriented to person, place, and time. She appears well-developed and well-nourished.  Well appearing. Nontoxic appearing.   HENT:  Head: Normocephalic and atraumatic.  Neck: Normal range of motion.  Cardiovascular: Normal rate and intact distal pulses.   Distal pulses intact at lower extremities  Pulmonary/Chest: Effort normal. No respiratory distress.  Musculoskeletal: She exhibits tenderness. She exhibits no deformity.  Moderate amount of swelling to left ankle. No bruising or erythema. Maximally tender to left lateral malleolus.  Neurological: She is alert and oriented to person, place, and time.  Sensation intact to distal BLE . Muscle strength 5/5 against resistance to plantar flexion and dorsiflexion BLE.   Skin: Skin is warm and dry.  Psychiatric: She has a normal mood and affect. Her behavior is normal.  Nursing note and vitals reviewed.    ED Treatments / Results  DIAGNOSTIC STUDIES: Oxygen Saturation is 99% on RA, normal by my interpretation.   COORDINATION OF CARE: 3:34 PM- Encouraged pt to RICE the left ankle and follow up with orthopedics for continued pain. Pt verbalizes understanding and agrees to plan.  Medications  naproxen (NAPROSYN) tablet 500 mg (not administered)    Labs (all labs ordered are listed, but only abnormal results are displayed) Labs Reviewed - No data to display  EKG  EKG Interpretation None       Radiology Dg Ankle Complete Left  Result Date: 03/14/2016 CLINICAL DATA:  Pain following twisting injury EXAM: LEFT ANKLE COMPLETE - 3+ VIEW COMPARISON:  None. FINDINGS: Frontal, oblique, and lateral views were obtained. There is no fracture or joint effusion. The ankle mortise appears intact. No appreciable joint space narrowing or erosion. IMPRESSION: No demonstrable fracture  or arthropathy. Ankle mortise appears intact. Electronically Signed   By: Bretta Bang III M.D.   On: 03/14/2016 15:14    Procedures Procedures (including critical care time)  Medications Ordered in ED Medications  naproxen (NAPROSYN) tablet 500 mg (not administered)     Initial Impression / Assessment and Plan / ED Course  I have reviewed the triage vital signs and the nursing notes.  Pertinent labs & imaging results that were available during my care of the patient were reviewed by me and considered in my medical decision making (see chart for details).    Patient presents with left ankle pain secondary to inverting the ankle earlier today and hearing a pop. X-Ray negative for obvious fracture or dislocation. Pt advised to follow up with orthopedics in 2 weeks as needed. Patient given ace wrap and crutches while in ED, conservative therapy recommended and discussed.  Pt given naproxen. Patient will be discharged home & is agreeable with above plan. Returns precautions discussed. Pt appears safe for discharge.  I personally performed the services described in this documentation, which was scribed in my presence. The recorded information has been reviewed and is accurate.  Final Clinical Impressions(s) / ED Diagnoses   Final diagnoses:  Acute left ankle pain    New Prescriptions New Prescriptions   NAPROXEN (NAPROSYN) 500 MG TABLET    Take 1 tablet (500 mg total) by mouth 2 (two) times daily.     [address]Franc[address] Manuel SheffieldEspina, [place]PA 03/14/16 1615    Gerhard Munchobert Lockwood, MD 03/14/16 2046

## 2016-03-14 NOTE — ED Triage Notes (Signed)
Pt sts left ankle pain after twisting ankle today

## 2016-03-14 NOTE — ED Notes (Signed)
Patient over in xray. Patient will be brought to room after xray

## 2016-03-14 NOTE — Discharge Instructions (Signed)
1. Medications:  2. Treatment: Wear ace wrap for at least 2 weeks for stabilization of ankle. Use crutches as needed for comfort. Ice and elevate ankle throughout the day. Use ibuprofen or Naproxen for pain and swelling.  3. Follow-up: Call orthopedic in 2 weeks for follow up regarding today's visit as needed.   Contact a health care provider if: Your pain gets worse. Your pain is not relieved with medicines. You have a fever or chills. You are having more trouble with walking. You have new symptoms. Get help right away if: Your foot, leg, toes, or ankle tingles or becomes numb. Your foot, leg, toes, or ankle becomes swollen. Your foot, leg, toes, or ankle turns pale or blue.

## 2016-04-03 ENCOUNTER — Emergency Department (HOSPITAL_COMMUNITY)
Admission: EM | Admit: 2016-04-03 | Discharge: 2016-04-03 | Disposition: A | Discharge status: Home / Self Care | Payer: Managed Care, Other (non HMO)

## 2016-04-03 ENCOUNTER — Emergency Department (HOSPITAL_COMMUNITY)
Admission: EM | Admit: 2016-04-03 | Discharge: 2016-04-03 | Discharge status: Against Medical Advice | Payer: Managed Care, Other (non HMO)

## 2016-04-04 ENCOUNTER — Inpatient Hospital Stay (HOSPITAL_COMMUNITY): Payer: Managed Care, Other (non HMO) | Admitting: Anesthesiology

## 2016-04-04 ENCOUNTER — Ambulatory Visit: Payer: Self-pay | Admitting: General Surgery

## 2016-04-04 ENCOUNTER — Emergency Department (HOSPITAL_COMMUNITY)
Admission: EM | Admit: 2016-04-04 | Discharge: 2016-04-04 | Disposition: A | Discharge status: Home / Self Care | Payer: Managed Care, Other (non HMO) | Source: Home / Self Care | Attending: Emergency Medicine | Admitting: Emergency Medicine

## 2016-04-04 ENCOUNTER — Encounter (HOSPITAL_COMMUNITY): Admission: AD | Disposition: A | Discharge status: Home / Self Care | Payer: Self-pay | Source: Ambulatory Visit

## 2016-04-04 ENCOUNTER — Inpatient Hospital Stay (HOSPITAL_COMMUNITY)
Admission: AD | Admit: 2016-04-04 | Discharge: 2016-04-06 | DRG: 580 | Disposition: A | Discharge status: Home / Self Care | Payer: Managed Care, Other (non HMO) | Source: Ambulatory Visit | Attending: General Surgery | Admitting: General Surgery

## 2016-04-04 ENCOUNTER — Encounter (HOSPITAL_COMMUNITY): Payer: Self-pay | Admitting: *Deleted

## 2016-04-04 ENCOUNTER — Other Ambulatory Visit: Payer: Self-pay | Admitting: General Surgery

## 2016-04-04 ENCOUNTER — Encounter (HOSPITAL_COMMUNITY): Payer: Self-pay | Admitting: Emergency Medicine

## 2016-04-04 DIAGNOSIS — L732 Hidradenitis suppurativa: Secondary | ICD-10-CM

## 2016-04-04 DIAGNOSIS — L02416 Cutaneous abscess of left lower limb: Secondary | ICD-10-CM | POA: Diagnosis present

## 2016-04-04 DIAGNOSIS — F17200 Nicotine dependence, unspecified, uncomplicated: Secondary | ICD-10-CM | POA: Diagnosis present

## 2016-04-04 DIAGNOSIS — L02419 Cutaneous abscess of limb, unspecified: Secondary | ICD-10-CM | POA: Insufficient documentation

## 2016-04-04 DIAGNOSIS — L02411 Cutaneous abscess of right axilla: Secondary | ICD-10-CM | POA: Diagnosis present

## 2016-04-04 HISTORY — DX: Depression, unspecified: F32.A

## 2016-04-04 HISTORY — DX: Anxiety disorder, unspecified: F41.9

## 2016-04-04 HISTORY — DX: Major depressive disorder, single episode, unspecified: F32.9

## 2016-04-04 HISTORY — DX: Bipolar disorder, unspecified: F31.9

## 2016-04-04 LAB — MRSA PCR SCREENING: MRSA BY PCR: NEGATIVE

## 2016-04-04 LAB — I-STAT BETA HCG BLOOD, ED (NOT ORDERABLE)

## 2016-04-04 SURGERY — INCISION AND DRAINAGE, ABSCESS
Anesthesia: General | Laterality: Bilateral

## 2016-04-04 MED ORDER — ONDANSETRON 4 MG PO TBDP: 4.0000 mg | ORAL_TABLET | Freq: Four times a day (QID) | ORAL | Status: AC | PRN

## 2016-04-04 MED ORDER — ACETAMINOPHEN 650 MG RE SUPP: 650.0000 mg | Freq: Four times a day (QID) | RECTAL | Status: AC | PRN

## 2016-04-04 MED ORDER — CLINDAMYCIN HCL 150 MG PO CAPS
300.0000 mg | ORAL_CAPSULE | Freq: Once | ORAL | Status: AC
  Administered 2016-04-04: 300 mg via ORAL
  Filled 2016-04-04: qty 2

## 2016-04-04 MED ORDER — SODIUM CHLORIDE 0.9 % IV SOLN: 4.0000 mg | Freq: Four times a day (QID) | INTRAVENOUS | Status: AC | PRN

## 2016-04-04 MED ORDER — OXYCODONE-ACETAMINOPHEN 5-325 MG PO TABS
1.0000 | ORAL_TABLET | ORAL | Status: DC | PRN
  Administered 2016-04-04 – 2016-04-05 (×2): 2 via ORAL
  Administered 2016-04-05: 1 via ORAL
  Administered 2016-04-05 – 2016-04-06 (×4): 2 via ORAL
  Filled 2016-04-04 (×7): qty 2

## 2016-04-04 MED ORDER — HYDROCODONE-ACETAMINOPHEN 5-325 MG PO TABS
1.0000 | ORAL_TABLET | Freq: Once | ORAL | Status: AC
  Administered 2016-04-04: 1 via ORAL
  Filled 2016-04-04: qty 1

## 2016-04-04 MED ORDER — ENSURE ENLIVE PO LIQD
237.0000 mL | Freq: Two times a day (BID) | ORAL | Status: DC
  Administered 2016-04-06: 237 mL via ORAL

## 2016-04-04 MED ORDER — FENTANYL CITRATE (PF) 100 MCG/2ML IJ SOLN
INTRAMUSCULAR | Status: AC
  Filled 2016-04-04: qty 2

## 2016-04-04 MED ORDER — ACETAMINOPHEN 325 MG PO TABS: 650.0000 mg | ORAL_TABLET | Freq: Four times a day (QID) | ORAL | Status: AC | PRN

## 2016-04-04 MED ORDER — PROPOFOL 10 MG/ML IV BOLUS
INTRAVENOUS | Status: AC
  Filled 2016-04-04: qty 20

## 2016-04-04 MED ORDER — HYDROMORPHONE HCL 2 MG/ML IJ SOLN: 1.0000 mg | INTRAMUSCULAR | Status: AC | PRN

## 2016-04-04 MED ORDER — MIDAZOLAM HCL 2 MG/2ML IJ SOLN
INTRAMUSCULAR | Status: AC
  Filled 2016-04-04: qty 2

## 2016-04-04 MED ORDER — LACTATED RINGERS IV SOLN
INTRAVENOUS | Status: DC
  Administered 2016-04-04: 18:00:00 via INTRAVENOUS

## 2016-04-04 MED ORDER — ENOXAPARIN SODIUM 150 MG/ML ~~LOC~~ SOLN: 40.0000 mg | SUBCUTANEOUS | Status: AC

## 2016-04-04 MED ORDER — SODIUM CHLORIDE 0.9 % IV SOLN
INTRAVENOUS | Status: DC
  Administered 2016-04-05: 08:00:00 via INTRAVENOUS

## 2016-04-04 NOTE — H&P (View-Only) (Signed)
  20 yof with history of hidradenitis who is referred from ER for right axillary abscess. This began several days ago and has been worsening over this time. She has no fever. She was seen in the er this morning and was told by them that this was only drained under local and there were no other options. she was referred to our office   Past Surgical History Emelia Loron(Cerria Randhawa, MD; 04/04/2016 3:35 PM) No pertinent past surgical history  Diagnostic Studies History Emelia Loron(Arshad Oberholzer, MD; 04/04/2016 3:35 PM) Colonoscopy never Mammogram never Pap Smear 1-5 years ago  Allergies Timmothy Euler(Sade Bradford, CMA; 04/04/2016 2:57 PM) No Known Drug Allergies 11/22/2014  Medication History Emelia Loron(Alando Colleran, MD; 04/04/2016 3:35 PM) Zoloft (25MG  Tablet, Oral daily) Active. Latuda (20MG  Tablet, Oral daily) Active. Medications Reconciled No Current Medications  Social History Emelia Loron(Nelli Swalley, MD; 04/04/2016 3:36 PM) Current tobacco use Current every day smoker.    Vitals Barron Alvine(Sade Bradford CMA; 04/04/2016 2:58 PM) 04/04/2016 2:57 PM Weight: 250 lb Height: 61in Body Surface Area: 2.08 m Body Mass Index: 47.24 kg/m  Temp.: 98.47F  Pulse: 95 (Regular)  BP: 122/82 (Sitting, Left Arm, Standard)      Physical Exam Emelia Loron(Venida Tsukamoto MD; 04/04/2016 3:29 PM)  General Mental Status-Alert. Orientation-Oriented X3.  Chest and Lung Exam Chest and lung exam reveals -on auscultation, normal breath sounds, no adventitious sounds and normal vocal resonance. Note: right axillary hidradenitis tender difficult to let me examine due to pain and her pulling away  Cardiovascular Cardiovascular examination reveals -normal heart sounds, regular rate and rhythm with no murmurs.    Assessment & Plan Emelia Loron(Retal Tonkinson MD; 04/04/2016 3:36 PM)  HIDRADENITIS (L73.2) Story: appears she has right axillary abscess, she will not tolerate even an exam in the office. I think this will require  anesthesia and evaluation with drainage. we discussed postop wound and packing. will admit to [facility] and have this done under anesthesia by one of my partners

## 2016-04-04 NOTE — Discharge Instructions (Signed)
You have an appointment in the urgent clinic with Dr. Dwain SarnaWakefield today at 2:45. Please go to this scheduled appointment!  Return to ER for fevers, new or worsening symptoms, any additional concerns.

## 2016-04-04 NOTE — H&P (Signed)
  20 yof with history of hidradenitis who is referred from ER for right axillary abscess. This began several days ago and has been worsening over this time. She has no fever. She was seen in the er this morning and was told by them that this was only drained under local and there were no other options. she was referred to our office   Past Surgical History (Shamel Galyean, MD; 04/04/2016 3:35 PM) No pertinent past surgical history  Diagnostic Studies History (Cruzita Lipa, MD; 04/04/2016 3:35 PM) Colonoscopy never Mammogram never Pap Smear 1-5 years ago  Allergies (Sade Bradford, CMA; 04/04/2016 2:57 PM) No Known Drug Allergies 11/22/2014  Medication History (Mishawn Hemann, MD; 04/04/2016 3:35 PM) Zoloft (25MG Tablet, Oral daily) Active. Latuda (20MG Tablet, Oral daily) Active. Medications Reconciled No Current Medications  Social History (Ariz Terrones, MD; 04/04/2016 3:36 PM) Current tobacco use Current every day smoker.    Vitals (Sade Bradford CMA; 04/04/2016 2:58 PM) 04/04/2016 2:57 PM Weight: 250 lb Height: 61in Body Surface Area: 2.08 m Body Mass Index: 47.24 kg/m  Temp.: 98.4F  Pulse: 95 (Regular)  BP: 122/82 (Sitting, Left Arm, Standard)      Physical Exam (Breyon Sigg MD; 04/04/2016 3:29 PM)  General Mental Status-Alert. Orientation-Oriented X3.  Chest and Lung Exam Chest and lung exam reveals -on auscultation, normal breath sounds, no adventitious sounds and normal vocal resonance. Note: right axillary hidradenitis tender difficult to let me examine due to pain and her pulling away  Cardiovascular Cardiovascular examination reveals -normal heart sounds, regular rate and rhythm with no murmurs.    Assessment & Plan (Alizandra Loh MD; 04/04/2016 3:36 PM)  HIDRADENITIS (L73.2) Story: appears she has right axillary abscess, she will not tolerate even an exam in the office. I think this will require  anesthesia and evaluation with drainage. we discussed postop wound and packing. will admit to [facility] and have this done under anesthesia by one of my partners 

## 2016-04-04 NOTE — ED Provider Notes (Signed)
[facility]-EMERGENCY DEPT Provider Note   CSN: [id number] Arrival date & time: 04/04/16  0865  By signing my name below, I, Veronica Moyer, attest that this documentation has been prepared under the direction and in the presence of [facility], PA-C.Marland Kitchen Electronically Signed: Cynda Moyer, Scribe. 04/04/16. 10:36 AM.  History   Chief Complaint Chief Complaint  Patient presents with  . Abscess    HPI Comments: Veronica Moyer is a 21 y.o. female with a history of hidradenitis who presents to the Emergency Department complaining of gradually worsening pain to the axillary area of the right arm that began 5 days ago. Patient states she has recurrent "abscess" under her right arm. Patient states this is the worse she has ever had. Patient has associated swelling. Pain worse with movement and palpation. Patient reports seeing a surgeon at [facility] 11/2015, in which she was diagnosed with hidradenitis and prescribed antibiotics. She notes having to have the area drained in the past. No modifying factors indicated. Patient denies any drainage, fever, nausea, vomiting, or any other symptoms.    The history is provided by the patient. No language interpreter was used.    Past Medical History:  Diagnosis Date  . Hidradenitis suppurativa   . Medical history non-contributory     There are no active problems to display for this patient.   Past Surgical History:  Procedure Laterality Date  . NO PAST SURGERIES      OB History    Gravida Para Term Preterm AB Living   1       1     SAB TAB Ectopic Multiple Live Births                   Home Medications    Prior to Admission medications   Medication Sig Start Date End Date Taking? Authorizing Provider  albuterol (PROVENTIL HFA;VENTOLIN HFA) 108 (90 Base) MCG/ACT inhaler Inhale 1-2 puffs into the lungs every 6 (six) hours as needed for wheezing or shortness of breath. 03/07/15   Chase Picket Ward, PA-C  amoxicillin (AMOXIL) 875 MG  tablet Take 1 tablet (875 mg total) by mouth 2 (two) times daily. 02/06/16   Deatra Canter, FNP  benzonatate (TESSALON) 100 MG capsule Take 2 capsules (200 mg total) by mouth 2 (two) times daily as needed for cough. 12/05/15   Barrett Henle, PA-C  cephALEXin (KEFLEX) 500 MG capsule Take 1 capsule (500 mg total) by mouth 4 (four) times daily. 11/16/14   Felicie Morn, NP  clindamycin (CLEOCIN) 300 MG capsule Take 1 capsule (300 mg total) by mouth every 6 (six) hours. 06/23/15   Tharon Aquas, PA  HYDROcodone-acetaminophen (NORCO/VICODIN) 5-325 MG tablet Take 1 tablet by mouth every 6 (six) hours as needed for severe pain. 11/16/14   Felicie Morn, NP  ibuprofen (ADVIL,MOTRIN) 400 MG tablet Take 400 mg by mouth every 6 (six) hours as needed (pain).     Historical Provider, MD  ipratropium (ATROVENT) 0.06 % nasal spray Place 2 sprays into both nostrils 4 (four) times daily. 02/06/16   Deatra Canter, FNP  naproxen (NAPROSYN) 500 MG tablet Take 1 tablet (500 mg total) by mouth 2 (two) times daily. 03/14/16   Francisco Manuel Leith-Hatfield, [place]  oxymetazoline (AFRIN NASAL SPRAY) 0.05 % nasal spray Place 1 spray into both nostrils 2 (two) times daily. Spray once into each nostril twice daily for up to the next 3 days. Do not use for more than 3 days to prevent rebound  rhinorrhea. 12/05/15   Barrett Henle, PA-C  trimethoprim-polymyxin b (POLYTRIM) ophthalmic solution Place 2 drops into the left eye every 4 (four) hours. 06/23/15   Tharon Aquas, PA    Family History No family history on file.  Social History Social History  Substance Use Topics  . Smoking status: Never Smoker  . Smokeless tobacco: Never Used  . Alcohol use No     Allergies   Patient has no known allergies.   Review of Systems Review of Systems  Constitutional: Negative for fever.  Gastrointestinal: Negative for nausea and vomiting.  Skin: Negative for color change, pallor and rash.       Abscess to the right axilla.      Physical Exam Updated Vital Signs BP 133/80 (BP Location: Left Arm)   Pulse (!) 104   Temp 99 F (37.2 C) (Oral)   Resp 16   Ht 5\' 1"  (1.549 m)   Wt 113.4 kg   SpO2 97%   BMI 47.24 kg/m   Physical Exam  Constitutional: She appears well-developed and well-nourished. No distress.  HENT:  Head: Normocephalic and atraumatic.  Neck: Neck supple.  Cardiovascular: Normal rate, regular rhythm and normal heart sounds.   No murmur heard. Pulmonary/Chest: Effort normal and breath sounds normal. No respiratory distress. She has no wheezes. She has no rales.  Musculoskeletal: Normal range of motion.  Neurological: She is alert.  Skin: Skin is warm and dry. Capillary refill takes less than 2 seconds. No rash noted. There is erythema. No pallor.  Area of tenderness and fluctuance to the axilla. No active drainage appreciated, no erythema seen.   Psychiatric: She has a normal mood and affect.  Nursing note and vitals reviewed.    ED Treatments / Results  DIAGNOSTIC STUDIES: Oxygen Saturation is 97% on RA, normal by my interpretation.    COORDINATION OF CARE: 10:36 AM Discussed treatment plan with pt at bedside and pt agreed to plan, which includes antibiotics and a referral to surgeon.   Labs (all labs ordered are listed, but only abnormal results are displayed) Labs Reviewed - No data to display  EKG  EKG Interpretation None       Radiology No results found.  Procedures Procedures (including critical care time)  Medications Ordered in ED Medications  HYDROcodone-acetaminophen (NORCO/VICODIN) 5-325 MG per tablet 1 tablet (1 tablet Oral Given 04/04/16 1052)  clindamycin (CLEOCIN) capsule 300 mg (300 mg Oral Given 04/04/16 1052)     Initial Impression / Assessment and Plan / ED Course  I have reviewed the triage vital signs and the nursing notes.  Pertinent labs & imaging results that were available during my care of the patient were reviewed by me and considered in  my medical decision making (see chart for details).    [name] is a 21 y.o. female who presents to ED for gradually worsening abscess to the right axilla x 4 days. Patient has a history of hidradenitis followed by [facility] surgery. On exam, patient does have an abscess to the right axilla which needs to be drained. Patient unfortunately will not let me perform I&D, stating that she would like to be admitted and put to sleep for the procedure. I informed her that this is unnecessary and that we are able to perform this procedure today without sedation. I informed her this is a very common, standard Emergency Department procedure, however she still declines. I specifically discussed that without I&D, there is a high chance antibiotics alone  will not heal abscess. Given [facility], I called the clinic to inquire about making an appointment for her today. Dr. Dwain SarnaWakefield has agreed to see [facility]  today at 245 and this is greatly appreciated. Will give first dose of antibiotics and pain medication and have her follow up with the surgeon. Patient is aware of the importance of going to this appointment and is aware of time and where to go. All questions answered.  Final Clinical Impressions(s) / ED Diagnoses   Final diagnoses:  Hidradenitis suppurativa of right axilla    New Prescriptions Discharge Medication List as of 04/04/2016 10:48 AM     I personally performed the services described in this documentation, which was scribed in my presence. The recorded information has been reviewed and is accurate.    [facility] County HospitalJaime Pilcher Ward, PA-C 04/04/16 1154    Benjiman CoreNathan Pickering, MD 04/04/16 [phone]

## 2016-04-04 NOTE — ED Triage Notes (Signed)
Pt states she developed an abscess under her right arm that started sat. No drainage noted

## 2016-04-04 NOTE — Interval H&P Note (Signed)
History and Physical Interval Note:  This patient was sent over from the [facility] office with pain and tenderness in the right axilla.  She told us here that she also has tenderness behind her left thigh.  Cannot get a good feel for the problem in her armpit, but will get better examination under anesthesia.  Marta LamasJames O. Gae BonWyatt, III, MD, FACS [phone]--pager [phone]--office [facility]  04/04/2016 5:37 PM  Lacey JensenKeyana Robillard  has presented today for surgery, with the diagnosis of Right axillary abscess and left posterior thigh  The various methods of treatment have been discussed with the patient and family. After consideration of risks, benefits and other options for treatment, the patient has consented to  Procedure(s) with comments: INCISION AND DRAINAGE ABSCESS (Bilateral) - Right arm on arm board and in lithotomy position for left posterior thigh as a surgical intervention .  The patient's history has been reviewed, patient examined, no change in status, stable for surgery.  I have reviewed the patient's chart and labs.  Questions were answered to the patient's satisfaction.     Isaac Lacson

## 2016-04-04 NOTE — ED Notes (Signed)
Pt given po meds with several swallows of ginger ale and told not to have anything else to est or drink till she had seen the dr,just in case he wants to do surgery I and D of abcess this afternoon. Pt states understanding of all

## 2016-04-04 NOTE — Progress Notes (Signed)
Admitted to 6N 24 alert and oriented, not in any distress. VSS. MD in for evaluation. Patient will be going to OR for I&D. [facility].

## 2016-04-04 NOTE — Anesthesia Preprocedure Evaluation (Deleted)
Anesthesia Evaluation  Patient identified by MRN, date of birth, ID band Patient awake    Reviewed: Allergy & Precautions, NPO status , Patient's Chart, lab work & pertinent test results  Airway Mallampati: II  TM Distance: <3 FB Neck ROM: Full    Dental no notable dental hx.    Pulmonary neg pulmonary ROS,    Pulmonary exam normal breath sounds clear to auscultation       Cardiovascular negative cardio ROS Normal cardiovascular exam Rhythm:Regular Rate:Normal     Neuro/Psych negative neurological ROS  negative psych ROS   GI/Hepatic negative GI ROS, Neg liver ROS,   Endo/Other  Morbid obesity  Renal/GU negative Renal ROS  negative genitourinary   Musculoskeletal negative musculoskeletal ROS (+)   Abdominal   Peds negative pediatric ROS (+)  Hematology negative hematology ROS (+)   Anesthesia Other Findings   Reproductive/Obstetrics negative OB ROS                             Anesthesia Physical Anesthesia Plan  ASA: II  Anesthesia Plan: General   Post-op Pain Management:    Induction: Intravenous  Airway Management Planned: Oral ETT  Additional Equipment:   Intra-op Plan:   Post-operative Plan: Extubation in OR  Informed Consent: I have reviewed the patients History and Physical, chart, labs and discussed the procedure including the risks, benefits and alternatives for the proposed anesthesia with the patient or authorized representative who has indicated his/her understanding and acceptance.   Dental advisory given  Plan Discussed with: CRNA and Surgeon  Anesthesia Plan Comments:         Anesthesia Quick Evaluation  

## 2016-04-04 NOTE — Progress Notes (Signed)
Case postponed until tomorrow because of two Level I trauma patients, and a Level II that required surgery.  Will put on for tomorrow. For Dr. Sheliah HatchKinsinger.  Marta LamasJames O. Gae BonWyatt, III, MD, FACS [phone]--pager [phone]--office [facility]

## 2016-04-05 ENCOUNTER — Inpatient Hospital Stay (HOSPITAL_COMMUNITY): Payer: Managed Care, Other (non HMO) | Admitting: Certified Registered Nurse Anesthetist

## 2016-04-05 ENCOUNTER — Encounter (HOSPITAL_COMMUNITY): Payer: Self-pay | Admitting: *Deleted

## 2016-04-05 ENCOUNTER — Encounter (HOSPITAL_COMMUNITY): Admission: AD | Disposition: A | Discharge status: Home / Self Care | Payer: Self-pay | Source: Ambulatory Visit

## 2016-04-05 DIAGNOSIS — L732 Hidradenitis suppurativa: Secondary | ICD-10-CM | POA: Diagnosis present

## 2016-04-05 HISTORY — PX: IRRIGATION AND DEBRIDEMENT ABSCESS: SHX5252

## 2016-04-05 SURGERY — IRRIGATION AND DEBRIDEMENT ABSCESS
Anesthesia: Regional | Site: Axilla

## 2016-04-05 MED ORDER — KETOROLAC TROMETHAMINE 30 MG/ML IJ SOLN
INTRAMUSCULAR | Status: AC
  Administered 2016-04-05: 30 mg
  Filled 2016-04-05: qty 1

## 2016-04-05 MED ORDER — LURASIDONE HCL 20 MG PO TABS
20.0000 mg | ORAL_TABLET | Freq: Every day | ORAL | Status: DC
  Administered 2016-04-05 – 2016-04-06 (×2): 20 mg via ORAL
  Filled 2016-04-05 (×3): qty 1

## 2016-04-05 MED ORDER — POLYMYXIN B-TRIMETHOPRIM 10000-0.1 UNIT/ML-% OP SOLN: 2.0000 [drp] | OPHTHALMIC | Status: DC

## 2016-04-05 MED ORDER — FENTANYL CITRATE (PF) 100 MCG/2ML IJ SOLN
INTRAMUSCULAR | Status: AC
  Filled 2016-04-05: qty 2

## 2016-04-05 MED ORDER — CEFAZOLIN SODIUM 1 G IJ SOLR
INTRAMUSCULAR | Status: DC | PRN
  Administered 2016-04-05: 2 g via INTRAMUSCULAR

## 2016-04-05 MED ORDER — HYDROMORPHONE HCL 1 MG/ML IJ SOLN
INTRAMUSCULAR | Status: AC
  Filled 2016-04-05: qty 0.5

## 2016-04-05 MED ORDER — LACTATED RINGERS IV SOLN
INTRAVENOUS | Status: DC
  Administered 2016-04-05: 09:00:00 via INTRAVENOUS

## 2016-04-05 MED ORDER — MIDAZOLAM HCL 5 MG/5ML IJ SOLN
INTRAMUSCULAR | Status: DC | PRN
  Administered 2016-04-05: 2 mg via INTRAVENOUS

## 2016-04-05 MED ORDER — PROPOFOL 10 MG/ML IV BOLUS
INTRAVENOUS | Status: AC
  Filled 2016-04-05: qty 20

## 2016-04-05 MED ORDER — LIDOCAINE 2% (20 MG/ML) 5 ML SYRINGE
INTRAMUSCULAR | Status: DC | PRN
  Administered 2016-04-05: 60 mg via INTRAVENOUS

## 2016-04-05 MED ORDER — SODIUM CHLORIDE 0.9 % IV SOLN
INTRAVENOUS | Status: DC
  Administered 2016-04-05: 15:00:00 via INTRAVENOUS

## 2016-04-05 MED ORDER — IPRATROPIUM BROMIDE 0.06 % NA SOLN: 2.0000 | Freq: Four times a day (QID) | NASAL | Status: DC

## 2016-04-05 MED ORDER — ROCURONIUM BROMIDE 50 MG/5ML IV SOSY
PREFILLED_SYRINGE | INTRAVENOUS | Status: AC
  Filled 2016-04-05: qty 5

## 2016-04-05 MED ORDER — FENTANYL CITRATE (PF) 100 MCG/2ML IJ SOLN
INTRAMUSCULAR | Status: DC | PRN
  Administered 2016-04-05 (×2): 50 ug via INTRAVENOUS

## 2016-04-05 MED ORDER — PROMETHAZINE HCL 25 MG/ML IJ SOLN: 6.2500 mg | INTRAMUSCULAR | Status: DC | PRN

## 2016-04-05 MED ORDER — BUPIVACAINE HCL (PF) 0.25 % IJ SOLN
INTRAMUSCULAR | Status: DC | PRN
  Administered 2016-04-05: 20 mL

## 2016-04-05 MED ORDER — ALBUTEROL SULFATE (2.5 MG/3ML) 0.083% IN NEBU: 3.0000 mL | INHALATION_SOLUTION | Freq: Four times a day (QID) | RESPIRATORY_TRACT | Status: DC | PRN

## 2016-04-05 MED ORDER — DEXAMETHASONE SODIUM PHOSPHATE 10 MG/ML IJ SOLN
INTRAMUSCULAR | Status: DC | PRN
  Administered 2016-04-05: 10 mg via INTRAVENOUS

## 2016-04-05 MED ORDER — ROCURONIUM BROMIDE 10 MG/ML (PF) SYRINGE
PREFILLED_SYRINGE | INTRAVENOUS | Status: DC | PRN
  Administered 2016-04-05: 40 mg via INTRAVENOUS

## 2016-04-05 MED ORDER — ESMOLOL HCL 100 MG/10ML IV SOLN
INTRAVENOUS | Status: DC | PRN
  Administered 2016-04-05: 20 mg via INTRAVENOUS

## 2016-04-05 MED ORDER — BENZONATATE 100 MG PO CAPS
200.0000 mg | ORAL_CAPSULE | Freq: Two times a day (BID) | ORAL | Status: DC | PRN
Start: 2016-04-05 — End: 2016-04-05

## 2016-04-05 MED ORDER — ESMOLOL HCL 100 MG/10ML IV SOLN
INTRAVENOUS | Status: AC
  Filled 2016-04-05: qty 10

## 2016-04-05 MED ORDER — HYDROMORPHONE HCL 1 MG/ML IJ SOLN
0.2500 mg | INTRAMUSCULAR | Status: DC | PRN
  Administered 2016-04-05 (×2): 0.5 mg via INTRAVENOUS

## 2016-04-05 MED ORDER — ONDANSETRON HCL 4 MG/2ML IJ SOLN
INTRAMUSCULAR | Status: AC
  Filled 2016-04-05: qty 2

## 2016-04-05 MED ORDER — DEXMEDETOMIDINE HCL IN NACL 200 MCG/50ML IV SOLN
INTRAVENOUS | Status: AC
  Filled 2016-04-05: qty 50

## 2016-04-05 MED ORDER — SERTRALINE HCL 100 MG PO TABS
100.0000 mg | ORAL_TABLET | Freq: Every day | ORAL | Status: DC
  Administered 2016-04-05 – 2016-04-06 (×2): 100 mg via ORAL
  Filled 2016-04-05 (×2): qty 1

## 2016-04-05 MED ORDER — 0.9 % SODIUM CHLORIDE (POUR BTL) OPTIME
TOPICAL | Status: DC | PRN
  Administered 2016-04-05: 1000 mL

## 2016-04-05 MED ORDER — OXYMETAZOLINE HCL 0.05 % NA SOLN: 1.0000 | Freq: Two times a day (BID) | NASAL | Status: DC

## 2016-04-05 MED ORDER — LABETALOL HCL 5 MG/ML IV SOLN
INTRAVENOUS | Status: AC
  Filled 2016-04-05: qty 4

## 2016-04-05 MED ORDER — KETOROLAC TROMETHAMINE 30 MG/ML IJ SOLN: 30.0000 mg | Freq: Once | INTRAMUSCULAR | Status: DC | PRN

## 2016-04-05 MED ORDER — PROPOFOL 10 MG/ML IV BOLUS
INTRAVENOUS | Status: DC | PRN
  Administered 2016-04-05 (×2): 200 mg via INTRAVENOUS

## 2016-04-05 MED ORDER — SUGAMMADEX SODIUM 500 MG/5ML IV SOLN
INTRAVENOUS | Status: DC | PRN
  Administered 2016-04-05: 500 mg via INTRAVENOUS

## 2016-04-05 MED ORDER — LIDOCAINE 2% (20 MG/ML) 5 ML SYRINGE
INTRAMUSCULAR | Status: AC
  Filled 2016-04-05: qty 5

## 2016-04-05 MED ORDER — OXYCODONE HCL 5 MG/5ML PO SOLN: 5.0000 mg | Freq: Once | ORAL | Status: DC | PRN

## 2016-04-05 MED ORDER — KETOROLAC TROMETHAMINE 30 MG/ML IJ SOLN
INTRAMUSCULAR | Status: AC
  Filled 2016-04-05: qty 1

## 2016-04-05 MED ORDER — FENTANYL CITRATE (PF) 100 MCG/2ML IJ SOLN: 25.0000 ug | INTRAMUSCULAR | Status: DC | PRN

## 2016-04-05 MED ORDER — OXYCODONE HCL 5 MG PO TABS: 5.0000 mg | ORAL_TABLET | Freq: Once | ORAL | Status: DC | PRN

## 2016-04-05 MED ORDER — LACTATED RINGERS IV SOLN: INTRAVENOUS | Status: DC | PRN

## 2016-04-05 MED ORDER — SUCCINYLCHOLINE CHLORIDE 200 MG/10ML IV SOSY
PREFILLED_SYRINGE | INTRAVENOUS | Status: DC | PRN
  Administered 2016-04-05: 40 mg via INTRAVENOUS
  Administered 2016-04-05: 120 mg via INTRAVENOUS

## 2016-04-05 MED ORDER — NAPROXEN 250 MG PO TABS: 500.0000 mg | ORAL_TABLET | Freq: Two times a day (BID) | ORAL | Status: DC

## 2016-04-05 MED ORDER — ALBUTEROL SULFATE HFA 108 (90 BASE) MCG/ACT IN AERS
INHALATION_SPRAY | RESPIRATORY_TRACT | Status: DC | PRN
  Administered 2016-04-05: 4 via RESPIRATORY_TRACT

## 2016-04-05 MED ORDER — SULFAMETHOXAZOLE-TRIMETHOPRIM 400-80 MG PO TABS
1.0000 | ORAL_TABLET | Freq: Two times a day (BID) | ORAL | Status: DC
  Administered 2016-04-05 – 2016-04-06 (×3): 1 via ORAL
  Filled 2016-04-05 (×4): qty 1

## 2016-04-05 MED ORDER — MIDAZOLAM HCL 2 MG/2ML IJ SOLN
INTRAMUSCULAR | Status: AC
  Filled 2016-04-05: qty 2

## 2016-04-05 SURGICAL SUPPLY — 32 items
BLADE CLIPPER SURG (BLADE) IMPLANT
BNDG GAUZE ELAST 4 BULKY (GAUZE/BANDAGES/DRESSINGS) IMPLANT
CANISTER SUCT 3000ML PPV (MISCELLANEOUS) ×3 IMPLANT
COVER SURGICAL LIGHT HANDLE (MISCELLANEOUS) ×3 IMPLANT
DRAPE LAPAROSCOPIC ABDOMINAL (DRAPES) IMPLANT
DRAPE LAPAROTOMY 100X72 PEDS (DRAPES) IMPLANT
DRSG PAD ABDOMINAL 8X10 ST (GAUZE/BANDAGES/DRESSINGS) IMPLANT
ELECT CAUTERY BLADE 6.4 (BLADE) ×3 IMPLANT
ELECT REM PT RETURN 9FT ADLT (ELECTROSURGICAL) ×3
ELECTRODE REM PT RTRN 9FT ADLT (ELECTROSURGICAL) ×1 IMPLANT
GAUZE SPONGE 4X4 12PLY STRL (GAUZE/BANDAGES/DRESSINGS) ×6 IMPLANT
GLOVE BIOGEL PI IND STRL 7.5 (GLOVE) ×1 IMPLANT
GLOVE BIOGEL PI INDICATOR 7.5 (GLOVE) ×2
GLOVE SURG SS PI 7.0 STRL IVOR (GLOVE) ×3 IMPLANT
GOWN STRL REUS W/ TWL LRG LVL3 (GOWN DISPOSABLE) ×2 IMPLANT
GOWN STRL REUS W/TWL LRG LVL3 (GOWN DISPOSABLE) ×4
KIT BASIN OR (CUSTOM PROCEDURE TRAY) ×3 IMPLANT
KIT ROOM TURNOVER OR (KITS) ×3 IMPLANT
NEEDLE 18GX1X1/2 (RX/OR ONLY) (NEEDLE) ×6 IMPLANT
NEEDLE HYPO 25GX1X1/2 BEV (NEEDLE) ×3 IMPLANT
NS IRRIG 1000ML POUR BTL (IV SOLUTION) ×3 IMPLANT
PACK SURGICAL SETUP 50X90 (CUSTOM PROCEDURE TRAY) ×3 IMPLANT
PAD ARMBOARD 7.5X6 YLW CONV (MISCELLANEOUS) ×3 IMPLANT
PENCIL BUTTON HOLSTER BLD 10FT (ELECTRODE) ×3 IMPLANT
SWAB COLLECTION DEVICE MRSA (MISCELLANEOUS) IMPLANT
SYRINGE 10CC LL (SYRINGE) ×6 IMPLANT
TOWEL OR 17X24 6PK STRL BLUE (TOWEL DISPOSABLE) ×3 IMPLANT
TOWEL OR 17X26 10 PK STRL BLUE (TOWEL DISPOSABLE) ×3 IMPLANT
TUBE ANAEROBIC SPECIMEN COL (MISCELLANEOUS) IMPLANT
TUBE CONNECTING 12'X1/4 (SUCTIONS) ×1
TUBE CONNECTING 12X1/4 (SUCTIONS) ×2 IMPLANT
YANKAUER SUCT BULB TIP NO VENT (SUCTIONS) ×3 IMPLANT

## 2016-04-05 NOTE — Anesthesia Preprocedure Evaluation (Addendum)
Anesthesia Evaluation  Patient identified by MRN, date of birth, ID band Patient awake    Airway Mallampati: II  TM Distance: >3 FB     Dental   Pulmonary Current Smoker,    breath sounds clear to auscultation       Cardiovascular negative cardio ROS   Rhythm:Regular Rate:Normal     Neuro/Psych    GI/Hepatic negative GI ROS, Neg liver ROS,   Endo/Other  negative endocrine ROS  Renal/GU negative Renal ROS     Musculoskeletal   Abdominal   Peds  Hematology   Anesthesia Other Findings   Reproductive/Obstetrics                             Anesthesia Physical Anesthesia Plan  ASA: II  Anesthesia Plan: General   Post-op Pain Management:  Regional for Post-op pain   Induction: Intravenous  Airway Management Planned: LMA  Additional Equipment:   Intra-op Plan:   Post-operative Plan:   Informed Consent: I have reviewed the patients History and Physical, chart, labs and discussed the procedure including the risks, benefits and alternatives for the proposed anesthesia with the patient or authorized representative who has indicated his/her understanding and acceptance.   Dental advisory given  Plan Discussed with: CRNA, Anesthesiologist and Surgeon  Anesthesia Plan Comments:        Anesthesia Quick Evaluation

## 2016-04-05 NOTE — Progress Notes (Signed)
Arrived back to 6n24 from PACU, denies nausea, right aem axillary 5/10 pain, family at bedside

## 2016-04-05 NOTE — Anesthesia Procedure Notes (Signed)
Procedure Name: Intubation Date/Time: 04/05/2016 9:39 AM Performed by: Annabelle HarmanSMITH, Ashraf Mesta A Pre-anesthesia Checklist: Patient identified, Emergency Drugs available, Suction available and Patient being monitored Patient Re-evaluated:Patient Re-evaluated prior to inductionOxygen Delivery Method: Circle system utilized Preoxygenation: Pre-oxygenation with 100% oxygen Intubation Type: IV induction Ventilation: Mask ventilation with difficulty, Oral airway inserted - appropriate to patient size and Two handed mask ventilation required Laryngoscope Size: Miller and 2 Grade View: Grade II Tube type: Oral Tube size: 7.5 mm Number of attempts: 1 Airway Equipment and Method: Stylet Placement Confirmation: ETT inserted through vocal cords under direct vision,  positive ETCO2 and breath sounds checked- equal and bilateral Secured at: 22 cm Tube secured with: Tape Dental Injury: Teeth and Oropharynx as per pre-operative assessment

## 2016-04-05 NOTE — Anesthesia Procedure Notes (Signed)
Procedure Name: LMA Insertion Date/Time: 04/05/2016 9:26 AM Performed by: Annabelle HarmanSMITH, Eulon Allnutt A Pre-anesthesia Checklist: Patient identified, Emergency Drugs available, Suction available and Patient being monitored Patient Re-evaluated:Patient Re-evaluated prior to inductionOxygen Delivery Method: Circle system utilized Preoxygenation: Pre-oxygenation with 100% oxygen Intubation Type: IV induction Ventilation: Mask ventilation without difficulty LMA: LMA inserted LMA Size: 4.0 Number of attempts: 1 Placement Confirmation: positive ETCO2 and breath sounds checked- equal and bilateral Tube secured with: Tape Dental Injury: Teeth and Oropharynx as per pre-operative assessment

## 2016-04-05 NOTE — Transfer of Care (Signed)
Immediate Anesthesia Transfer of Care Note  Patient: Veronica JensenKeyana Kellenberger  Procedure(s) Performed: Procedure(s): IRRIGATION AND DEBRIDEMENT RIGHT AXILLARY & LEFT THIGH ABSCESSES (N/A)  Patient Location: PACU  Anesthesia Type:General  Level of Consciousness: awake, alert , oriented and patient cooperative  Airway & Oxygen Therapy: Patient Spontanous Breathing and Patient connected to face mask oxygen  Post-op Assessment: Report given to RN, Post -op Vital signs reviewed and stable and Patient moving all extremities X 4  Post vital signs: Reviewed and stable  Last Vitals:  Vitals:   04/05/16 0812 04/05/16 1030  BP: 138/68   Pulse: 78   Resp:    Temp: 37.1 C (P) 37 C    Last Pain:  Vitals:   04/05/16 0812  TempSrc: Oral  PainSc:          Complications: No apparent anesthesia complications

## 2016-04-05 NOTE — Op Note (Addendum)
Preoperative diagnosis: hidradenitis  Postoperative diagnosis: same   Procedure: incision and drainage of right axilla and left thigh  Surgeon: Feliciana RossettiLuke Mirage Pfefferkorn, M.D.  Asst: none  Anesthesia: general  Indications for procedure: Veronica Moyer is a 21 y.o. year old female with symptoms of pain in her right axilla and thigh.  Description of procedure: The patient was brought into the operative suite. Anesthesia was administered with General endotracheal anesthesia. WHO checklist was applied. The patient was then placed in lithotomy position. The area was prepped and draped in the usual sterile fashion.  Local anesthesia was used to infiltrate the surrounding area and then an elliptical incision was made over the area of hidradenitis anterior to the axillar fold. In the highest portion a moderate amount of purulence was expressed. The skin and subcu tissue was removed from the elliptical incision and the hemostasis was applied with cautery and packed with salinated gauze.  Next, I reinspected the thigh looking for area of greatest fluctuance. I attempted to aspirate several areas with an 18ga needle and was not able to identify any large underlying pockets. A small abscess was opened in the medial thigh by removing a 2cm area of skin and then packed with salinated gauze. The patient then awoke from anesthesia and was brought to pacu in stable condition  Findings: large abscess right axilla, small abscess left thigh  Specimen: none  Implant: salinated gauze   Blood loss: 30ml  Local anesthesia: 15ml 0.25% marcaine  Complications: none  Feliciana RossettiLuke Wyatt Galvan, M.D. General, Bariatric, & Minimally Invasive Surgery [facility]

## 2016-04-05 NOTE — Progress Notes (Signed)
REPORT GIVEN TO DAVID REES RN FOR LUNCH RELIEF

## 2016-04-05 NOTE — Anesthesia Postprocedure Evaluation (Signed)
Anesthesia Post Note  Patient: Veronica JensenKeyana Lang  Procedure(s) Performed: Procedure(s) (LRB): IRRIGATION AND DEBRIDEMENT RIGHT AXILLARY & LEFT THIGH ABSCESSES (N/A)  Patient location during evaluation: PACU Anesthesia Type: Regional Level of consciousness: awake Pain management: pain level controlled Vital Signs Assessment: post-procedure vital signs reviewed and stable Respiratory status: spontaneous breathing Cardiovascular status: stable Anesthetic complications: no       Last Vitals:  Vitals:   04/05/16 1141 04/05/16 1320  BP: (!) 164/66 (!) 152/83  Pulse: 80 79  Resp:    Temp: 37.1 C 37.3 C    Last Pain:  Vitals:   04/05/16 1551  TempSrc:   PainSc: 2                  Jawanna Dykman

## 2016-04-06 ENCOUNTER — Encounter (HOSPITAL_COMMUNITY): Payer: Self-pay | Admitting: General Surgery

## 2016-04-06 MED ORDER — SULFAMETHOXAZOLE-TRIMETHOPRIM 400-80 MG PO TABS: 1.0000 | ORAL_TABLET | Freq: Two times a day (BID) | ORAL | 0 refills | Status: DC

## 2016-04-06 MED ORDER — OXYCODONE-ACETAMINOPHEN 5-325 MG PO TABS: 1.0000 | ORAL_TABLET | ORAL | 0 refills | Status: DC | PRN

## 2016-04-06 MED ORDER — IBUPROFEN 600 MG PO TABS
600.0000 mg | ORAL_TABLET | Freq: Four times a day (QID) | ORAL | Status: DC | PRN
  Administered 2016-04-06: 600 mg via ORAL
  Filled 2016-04-06: qty 1

## 2016-04-06 NOTE — Discharge Summary (Signed)
[facility] Discharge Summary   Patient ID: Veronica Moyer MRN: [id number] DOB/AGE: [birth year] 21 y.o.  Admit date: 04/04/2016 Discharge date: 04/06/2016  Admitting Diagnosis: hydradenitis Right axillary abscess  Discharge Diagnosis Patient Active Problem List   Diagnosis Date Noted  . Hidradenitis 04/05/2016  . Axillary abscess 04/04/2016    Consultants None   Imaging: No results found.  Procedures Dr. Sheliah Hatch (04/05/16) - incision and drainage of right axilla and left thigh    Hospital Course:  Veronica Moyer is a 21 year old female with a history of hidradenitis who presented with a right axillary abscess.  Patient was admitted and underwent procedure listed above.  Tolerated procedure well and was transferred to the floor. On POD#1, the patient was voiding well, tolerating diet, ambulating well, pain well controlled, vital signs stable, incisions c/d/i and felt stable for discharge home. Only complaint was body aches on day of discharge. Pt was afebrile. Patient will follow up in our office in 3-4 weeks and knows to call with questions or concerns.  She will call to confirm appointment date/time.    Patient was discharged in good condition.  The [place] Substance controlled database was reviewed prior to prescribing narcotic pain medication to this patient.  Physical Exam: General:  Alert, NAD, pleasant, cooperative, well appearing Cardio: RRR, S1 & S2 normal, no murmur, rubs, gallops Resp: Effort normal, lungs CTA bilaterally, no wheezes, rales, rhonchi Skin: warm and dry, not diaphoretic. Right axillary wound roughly 6x4cm and 1.5cm deep with minimal bloody drainage no purulent drainage noted, mild TTP, repacked wet to dry. Small 1.5x1cm wound of left anterior mid thigh without purulent drainage and no pain.  Allergies as of 04/06/2016   No Known Allergies     Medication List    STOP taking these medications   amoxicillin 875 MG  tablet Commonly known as:  AMOXIL   benzonatate 100 MG capsule Commonly known as:  TESSALON   cephALEXin 500 MG capsule Commonly known as:  KEFLEX   clindamycin 300 MG capsule Commonly known as:  CLEOCIN   HYDROcodone-acetaminophen 5-325 MG tablet Commonly known as:  NORCO/VICODIN   naproxen 500 MG tablet Commonly known as:  NAPROSYN   trimethoprim-polymyxin b ophthalmic solution Commonly known as:  POLYTRIM     TAKE these medications   albuterol 108 (90 Base) MCG/ACT inhaler Commonly known as:  PROVENTIL HFA;VENTOLIN HFA Inhale 1-2 puffs into the lungs every 6 (six) hours as needed for wheezing or shortness of breath.   ibuprofen 400 MG tablet Commonly known as:  ADVIL,MOTRIN Take 400 mg by mouth every 6 (six) hours as needed (pain).   ipratropium 0.06 % nasal spray Commonly known as:  ATROVENT Place 2 sprays into both nostrils 4 (four) times daily.   LATUDA 20 MG Tabs tablet Generic drug:  lurasidone Take 20 mg by mouth daily.   oxyCODONE-acetaminophen 5-325 MG tablet Commonly known as:  PERCOCET/ROXICET Take 1 tablet by mouth every 4 (four) hours as needed for moderate pain (30 min prior to dressing change).   oxymetazoline 0.05 % nasal spray Commonly known as:  AFRIN NASAL SPRAY Place 1 spray into both nostrils 2 (two) times daily. Spray once into each nostril twice daily for up to the next 3 days. Do not use for more than 3 days to prevent rebound rhinorrhea.   sertraline 100 MG tablet Commonly known as:  ZOLOFT Take 100 mg by mouth daily.   sulfamethoxazole-trimethoprim 400-80 MG tablet Commonly known as:  BACTRIM,SEPTRA Take 1  tablet by mouth every 12 (twelve) hours.        Follow-up Information    Rodman PickleLuke Aaron Kinsinger, MD Follow up on 04/27/2016.   Specialty:  General Surgery Why:  10:30 appointment time. Please arrive at 10:15 to complete paperwork Contact information: [address] [place] [address] [phone]            Signed: [facility] 04/06/2016, 10:15 AM Pager: [phone] Consults: [phone] Mon-Fri 7:00 am-4:30 pm Sat-Sun 7:00 am-11:30 am

## 2016-04-06 NOTE — Discharge Instructions (Signed)
WOUND CARE: - left armpit dressing to be changed twice daily - supplies: sterile saline, 4x4 gauze, scissors, ABD pads, tape  - remove dressing and all packing carefully, moistening with sterile saline as needed to avoid packing/internal dressing sticking to the wound. - clean edges of skin around the wound with water/gauze, making sure there is no tape debris or leakage left on skin that could cause skin irritation or breakdown. - dampen and clean kerlix with sterile saline and pack wound from wound base to skin level, making sure to take note of any possible areas of wound tracking, tunneling and packing appropriately. Wound can be packed loosely. Trim kerlix to size if a whole kerlix is not required. - cover wound with a dry ABD pad and secure with tape.  - write the date/time on the dry dressing/tape to better track when the last dressing change occurred. - apply any skin protectant/powder recommended by clinician to protect skin/skin folds. - change dressing as needed if leakage occurs, wound gets contaminated, or patient requests to shower. - patient may shower daily with wound open and following the shower the wound should be dried and a clean dressing placed.

## 2016-05-03 ENCOUNTER — Other Ambulatory Visit: Payer: Self-pay | Admitting: General Surgery

## 2016-05-03 DIAGNOSIS — L732 Hidradenitis suppurativa: Secondary | ICD-10-CM

## 2016-05-10 ENCOUNTER — Ambulatory Visit
Admission: RE | Admit: 2016-05-10 | Discharge: 2016-05-10 | Disposition: A | Discharge status: Home / Self Care | Payer: Managed Care, Other (non HMO) | Source: Ambulatory Visit | Attending: General Surgery | Admitting: General Surgery

## 2016-05-10 ENCOUNTER — Other Ambulatory Visit: Payer: Self-pay | Admitting: General Surgery

## 2016-05-10 DIAGNOSIS — L732 Hidradenitis suppurativa: Secondary | ICD-10-CM

## 2016-05-15 DIAGNOSIS — L02419 Cutaneous abscess of limb, unspecified: Secondary | ICD-10-CM

## 2016-05-15 HISTORY — DX: Cutaneous abscess of limb, unspecified: L02.419

## 2016-05-15 NOTE — Progress Notes (Signed)
Can you check to see if she has follow up appt?

## 2016-05-16 ENCOUNTER — Encounter (HOSPITAL_BASED_OUTPATIENT_CLINIC_OR_DEPARTMENT_OTHER): Payer: Self-pay | Admitting: *Deleted

## 2016-05-16 ENCOUNTER — Other Ambulatory Visit: Payer: Self-pay | Admitting: General Surgery

## 2016-05-17 ENCOUNTER — Ambulatory Visit (HOSPITAL_BASED_OUTPATIENT_CLINIC_OR_DEPARTMENT_OTHER): Payer: Managed Care, Other (non HMO) | Admitting: Anesthesiology

## 2016-05-17 ENCOUNTER — Encounter (HOSPITAL_BASED_OUTPATIENT_CLINIC_OR_DEPARTMENT_OTHER)
Admission: RE | Disposition: A | Discharge status: Home / Self Care | Payer: Self-pay | Source: Ambulatory Visit | Attending: General Surgery

## 2016-05-17 ENCOUNTER — Ambulatory Visit (HOSPITAL_BASED_OUTPATIENT_CLINIC_OR_DEPARTMENT_OTHER)
Admission: RE | Admit: 2016-05-17 | Discharge: 2016-05-17 | Disposition: A | Discharge status: Home / Self Care | Payer: Managed Care, Other (non HMO) | Source: Ambulatory Visit | Attending: General Surgery | Admitting: General Surgery

## 2016-05-17 ENCOUNTER — Encounter (HOSPITAL_BASED_OUTPATIENT_CLINIC_OR_DEPARTMENT_OTHER): Payer: Self-pay | Admitting: *Deleted

## 2016-05-17 DIAGNOSIS — L02411 Cutaneous abscess of right axilla: Secondary | ICD-10-CM | POA: Insufficient documentation

## 2016-05-17 DIAGNOSIS — L732 Hidradenitis suppurativa: Secondary | ICD-10-CM | POA: Diagnosis not present

## 2016-05-17 DIAGNOSIS — F419 Anxiety disorder, unspecified: Secondary | ICD-10-CM | POA: Insufficient documentation

## 2016-05-17 DIAGNOSIS — Z6841 Body Mass Index (BMI) 40.0 and over, adult: Secondary | ICD-10-CM | POA: Diagnosis not present

## 2016-05-17 DIAGNOSIS — F329 Major depressive disorder, single episode, unspecified: Secondary | ICD-10-CM | POA: Diagnosis not present

## 2016-05-17 DIAGNOSIS — F319 Bipolar disorder, unspecified: Secondary | ICD-10-CM | POA: Diagnosis not present

## 2016-05-17 DIAGNOSIS — F17210 Nicotine dependence, cigarettes, uncomplicated: Secondary | ICD-10-CM | POA: Diagnosis not present

## 2016-05-17 HISTORY — PX: INCISION AND DRAINAGE ABSCESS: SHX5864

## 2016-05-17 HISTORY — DX: Cutaneous abscess of limb, unspecified: L02.419

## 2016-05-17 SURGERY — INCISION AND DRAINAGE, ABSCESS
Anesthesia: General | Site: Axilla | Laterality: Right

## 2016-05-17 MED ORDER — FENTANYL CITRATE (PF) 100 MCG/2ML IJ SOLN
INTRAMUSCULAR | Status: AC
  Filled 2016-05-17: qty 2

## 2016-05-17 MED ORDER — BUPIVACAINE-EPINEPHRINE (PF) 0.5% -1:200000 IJ SOLN
INTRAMUSCULAR | Status: AC
  Filled 2016-05-17: qty 30

## 2016-05-17 MED ORDER — FENTANYL CITRATE (PF) 100 MCG/2ML IJ SOLN
50.0000 ug | INTRAMUSCULAR | Status: AC | PRN
Start: 2016-05-17 — End: 2016-05-17
  Administered 2016-05-17: 50 ug via INTRAVENOUS
  Administered 2016-05-17: 100 ug via INTRAVENOUS
  Administered 2016-05-17: 50 ug via INTRAVENOUS

## 2016-05-17 MED ORDER — MEPERIDINE HCL 25 MG/ML IJ SOLN: 6.2500 mg | INTRAMUSCULAR | Status: DC | PRN

## 2016-05-17 MED ORDER — CEFAZOLIN SODIUM-DEXTROSE 2-4 GM/100ML-% IV SOLN
2.0000 g | INTRAVENOUS | Status: AC
  Administered 2016-05-17: 2 g via INTRAVENOUS

## 2016-05-17 MED ORDER — 0.9 % SODIUM CHLORIDE (POUR BTL) OPTIME
TOPICAL | Status: DC | PRN
  Administered 2016-05-17: 1000 mL

## 2016-05-17 MED ORDER — PROPOFOL 10 MG/ML IV BOLUS
INTRAVENOUS | Status: DC | PRN
Start: 2016-05-17 — End: 2016-05-17
  Administered 2016-05-17: 200 mg via INTRAVENOUS
  Administered 2016-05-17: 30 mg via INTRAVENOUS

## 2016-05-17 MED ORDER — FENTANYL CITRATE (PF) 100 MCG/2ML IJ SOLN
INTRAMUSCULAR | Status: AC
Start: 2016-05-17 — End: 2016-05-17
  Filled 2016-05-17: qty 2

## 2016-05-17 MED ORDER — OXYCODONE HCL 5 MG PO TABS
ORAL_TABLET | ORAL | Status: AC
  Filled 2016-05-17: qty 1

## 2016-05-17 MED ORDER — ONDANSETRON HCL 4 MG/2ML IJ SOLN
INTRAMUSCULAR | Status: AC
  Filled 2016-05-17: qty 2

## 2016-05-17 MED ORDER — HYDROCODONE-ACETAMINOPHEN 5-325 MG PO TABS: 1.0000 | ORAL_TABLET | Freq: Four times a day (QID) | ORAL | 0 refills | Status: DC | PRN

## 2016-05-17 MED ORDER — DEXAMETHASONE SODIUM PHOSPHATE 10 MG/ML IJ SOLN
INTRAMUSCULAR | Status: AC
  Filled 2016-05-17: qty 1

## 2016-05-17 MED ORDER — SCOPOLAMINE 1 MG/3DAYS TD PT72: 1.0000 | MEDICATED_PATCH | Freq: Once | TRANSDERMAL | Status: DC | PRN

## 2016-05-17 MED ORDER — CEFAZOLIN SODIUM-DEXTROSE 2-4 GM/100ML-% IV SOLN
INTRAVENOUS | Status: AC
  Filled 2016-05-17: qty 100

## 2016-05-17 MED ORDER — LIDOCAINE 2% (20 MG/ML) 5 ML SYRINGE
INTRAMUSCULAR | Status: DC | PRN
  Administered 2016-05-17: 60 mg via INTRAVENOUS

## 2016-05-17 MED ORDER — LIDOCAINE-EPINEPHRINE (PF) 1 %-1:200000 IJ SOLN
INTRAMUSCULAR | Status: AC
  Filled 2016-05-17: qty 30

## 2016-05-17 MED ORDER — DEXAMETHASONE SODIUM PHOSPHATE 4 MG/ML IJ SOLN
INTRAMUSCULAR | Status: DC | PRN
  Administered 2016-05-17: 10 mg via INTRAVENOUS

## 2016-05-17 MED ORDER — HYDROMORPHONE HCL 1 MG/ML IJ SOLN
0.5000 mg | INTRAMUSCULAR | Status: DC | PRN
  Administered 2016-05-17: 0.5 mg via INTRAVENOUS

## 2016-05-17 MED ORDER — HYDROMORPHONE HCL 1 MG/ML IJ SOLN
INTRAMUSCULAR | Status: AC
  Filled 2016-05-17: qty 1

## 2016-05-17 MED ORDER — MIDAZOLAM HCL 2 MG/2ML IJ SOLN
INTRAMUSCULAR | Status: AC
  Filled 2016-05-17: qty 2

## 2016-05-17 MED ORDER — BUPIVACAINE-EPINEPHRINE 0.5% -1:200000 IJ SOLN
INTRAMUSCULAR | Status: DC | PRN
  Administered 2016-05-17: 20 mL

## 2016-05-17 MED ORDER — LACTATED RINGERS IV SOLN
INTRAVENOUS | Status: DC
  Administered 2016-05-17 (×2): via INTRAVENOUS

## 2016-05-17 MED ORDER — PROPOFOL 10 MG/ML IV BOLUS
INTRAVENOUS | Status: AC
  Filled 2016-05-17: qty 20

## 2016-05-17 MED ORDER — CHLORHEXIDINE GLUCONATE CLOTH 2 % EX PADS: 6.0000 | MEDICATED_PAD | Freq: Once | CUTANEOUS | Status: DC

## 2016-05-17 MED ORDER — LACTATED RINGERS IV SOLN: INTRAVENOUS | Status: DC

## 2016-05-17 MED ORDER — BUPIVACAINE HCL (PF) 0.25 % IJ SOLN
INTRAMUSCULAR | Status: AC
  Filled 2016-05-17: qty 30

## 2016-05-17 MED ORDER — ONDANSETRON HCL 4 MG/2ML IJ SOLN
INTRAMUSCULAR | Status: DC | PRN
  Administered 2016-05-17: 4 mg via INTRAVENOUS

## 2016-05-17 MED ORDER — METOCLOPRAMIDE HCL 5 MG/ML IJ SOLN: 10.0000 mg | Freq: Once | INTRAMUSCULAR | Status: DC | PRN

## 2016-05-17 MED ORDER — FENTANYL CITRATE (PF) 100 MCG/2ML IJ SOLN
25.0000 ug | INTRAMUSCULAR | Status: DC | PRN
  Administered 2016-05-17 (×3): 50 ug via INTRAVENOUS

## 2016-05-17 MED ORDER — MIDAZOLAM HCL 2 MG/2ML IJ SOLN
1.0000 mg | INTRAMUSCULAR | Status: DC | PRN
  Administered 2016-05-17: 2 mg via INTRAVENOUS

## 2016-05-17 MED ORDER — OXYCODONE HCL 5 MG PO TABS
5.0000 mg | ORAL_TABLET | Freq: Once | ORAL | Status: AC
  Administered 2016-05-17: 5 mg via ORAL

## 2016-05-17 SURGICAL SUPPLY — 38 items
BLADE SURG 10 STRL SS (BLADE) ×3 IMPLANT
BLADE SURG 15 STRL LF DISP TIS (BLADE) ×1 IMPLANT
BLADE SURG 15 STRL SS (BLADE) ×2
BNDG GAUZE ELAST 4 BULKY (GAUZE/BANDAGES/DRESSINGS) ×3 IMPLANT
CANISTER SUCT 1200ML W/VALVE (MISCELLANEOUS) ×3 IMPLANT
CHLORAPREP W/TINT 26ML (MISCELLANEOUS) ×3 IMPLANT
COVER MAYO STAND STRL (DRAPES) ×3 IMPLANT
COVER PROBE W GEL 5X96 (DRAPES) ×3 IMPLANT
DRAPE UTILITY XL STRL (DRAPES) ×3 IMPLANT
ELECT COATED BLADE 2.86 ST (ELECTRODE) ×3 IMPLANT
ELECT REM PT RETURN 9FT ADLT (ELECTROSURGICAL) ×3
ELECTRODE REM PT RTRN 9FT ADLT (ELECTROSURGICAL) ×1 IMPLANT
GAUZE SPONGE 4X4 12PLY STRL (GAUZE/BANDAGES/DRESSINGS) ×3 IMPLANT
GLOVE BIO SURGEON STRL SZ 6 (GLOVE) ×3 IMPLANT
GLOVE BIOGEL PI IND STRL 6.5 (GLOVE) ×1 IMPLANT
GLOVE BIOGEL PI INDICATOR 6.5 (GLOVE) ×2
GOWN STRL REUS W/ TWL LRG LVL3 (GOWN DISPOSABLE) ×1 IMPLANT
GOWN STRL REUS W/TWL 2XL LVL3 (GOWN DISPOSABLE) ×3 IMPLANT
GOWN STRL REUS W/TWL LRG LVL3 (GOWN DISPOSABLE) ×2
NDL SAFETY ECLIPSE 18X1.5 (NEEDLE) ×1 IMPLANT
NEEDLE HYPO 18GX1.5 SHARP (NEEDLE) ×2
NEEDLE HYPO 25X1 1.5 SAFETY (NEEDLE) ×3 IMPLANT
NS IRRIG 1000ML POUR BTL (IV SOLUTION) ×3 IMPLANT
PACK BASIN DAY SURGERY FS (CUSTOM PROCEDURE TRAY) ×3 IMPLANT
PENCIL BUTTON HOLSTER BLD 10FT (ELECTRODE) ×3 IMPLANT
SLEEVE SCD COMPRESS KNEE MED (MISCELLANEOUS) ×3 IMPLANT
SPONGE LAP 18X18 X RAY DECT (DISPOSABLE) ×3 IMPLANT
STOCKINETTE IMPERVIOUS LG (DRAPES) ×3 IMPLANT
SWAB COLLECTION DEVICE MRSA (MISCELLANEOUS) ×3 IMPLANT
SYR BULB 3OZ (MISCELLANEOUS) ×3 IMPLANT
SYR CONTROL 10ML LL (SYRINGE) ×6 IMPLANT
TAPE CLOTH SURG 6X10 WHT LF (GAUZE/BANDAGES/DRESSINGS) ×3 IMPLANT
TOWEL OR 17X24 6PK STRL BLUE (TOWEL DISPOSABLE) ×3 IMPLANT
TOWEL OR NON WOVEN STRL DISP B (DISPOSABLE) ×3 IMPLANT
TUBE ANAEROBIC SPECIMEN COL (MISCELLANEOUS) ×3 IMPLANT
TUBE CONNECTING 20'X1/4 (TUBING) ×1
TUBE CONNECTING 20X1/4 (TUBING) ×2 IMPLANT
YANKAUER SUCT BULB TIP NO VENT (SUCTIONS) ×3 IMPLANT

## 2016-05-17 NOTE — Anesthesia Procedure Notes (Signed)
Procedure Name: LMA Insertion Date/Time: 05/17/2016 3:22 PM Performed by: Burna CashONRAD, Shawnte Winton C Pre-anesthesia Checklist: Patient identified, Emergency Drugs available, Suction available and Patient being monitored Patient Re-evaluated:Patient Re-evaluated prior to inductionOxygen Delivery Method: Circle system utilized Preoxygenation: Pre-oxygenation with 100% oxygen Intubation Type: IV induction Ventilation: Mask ventilation without difficulty LMA: LMA inserted LMA Size: 4.0 Number of attempts: 1 Airway Equipment and Method: Bite block Placement Confirmation: positive ETCO2 Tube secured with: Tape Dental Injury: Teeth and Oropharynx as per pre-operative assessment

## 2016-05-17 NOTE — Anesthesia Preprocedure Evaluation (Signed)
Anesthesia Evaluation  Patient identified by MRN, date of birth, ID band Patient awake    Reviewed: Allergy & Precautions, NPO status , Patient's Chart, lab work & pertinent test results  History of Anesthesia Complications (+) DIFFICULT IV STICK / SPECIAL LINE  Airway Mallampati: II  TM Distance: >3 FB Neck ROM: Full    Dental no notable dental hx.    Pulmonary Current Smoker,    Pulmonary exam normal breath sounds clear to auscultation       Cardiovascular negative cardio ROS Normal cardiovascular exam Rhythm:Regular Rate:Normal     Neuro/Psych Bipolar Disorder negative neurological ROS     GI/Hepatic negative GI ROS, Neg liver ROS,   Endo/Other  Morbid obesity  Renal/GU negative Renal ROS  negative genitourinary   Musculoskeletal negative musculoskeletal ROS (+)   Abdominal   Peds negative pediatric ROS (+)  Hematology negative hematology ROS (+)   Anesthesia Other Findings   Reproductive/Obstetrics negative OB ROS                             Anesthesia Physical Anesthesia Plan  ASA: III  Anesthesia Plan: General   Post-op Pain Management:    Induction: Intravenous  Airway Management Planned: LMA  Additional Equipment:   Intra-op Plan:   Post-operative Plan: Extubation in OR  Informed Consent: I have reviewed the patients History and Physical, chart, labs and discussed the procedure including the risks, benefits and alternatives for the proposed anesthesia with the patient or authorized representative who has indicated his/her understanding and acceptance.   Dental advisory given  Plan Discussed with: CRNA  Anesthesia Plan Comments:         Anesthesia Quick Evaluation

## 2016-05-17 NOTE — Transfer of Care (Signed)
Immediate Anesthesia Transfer of Care Note  Patient: Veronica Moyer  Procedure(s) Performed: Procedure(s): INCISION AND DRAINAGE AXILLARY ABSCESS (Right)  Patient Location: PACU  Anesthesia Type:General  Level of Consciousness: awake, alert  and oriented  Airway & Oxygen Therapy: Patient Spontanous Breathing and Patient connected to face mask oxygen  Post-op Assessment: Report given to RN and Post -op Vital signs reviewed and stable  Post vital signs: Reviewed and stable  Last Vitals:  Vitals:   05/17/16 1310 05/17/16 1607  BP: 124/66 (P) 121/78  Pulse: 95 (!) (P) 110  Resp: 18 (P) 16  Temp: 37.2 C (P) 36.6 C    Last Pain:  Vitals:   05/17/16 1310  TempSrc: Oral  PainSc: 3       Patients Stated Pain Goal: 3 (05/17/16 1310)  Complications: No apparent anesthesia complications

## 2016-05-17 NOTE — Op Note (Signed)
Incision and Drainage Procedure Note   Pre-operative Diagnosis: right axillary abscess secondary to hidradenitis  Post-operative Diagnosis: same   Indications: Pt presented with phlegmon that did not resolve spontaneously with recurrent right axillary hidradenitis  Anesthesia: General   Procedure Details  The procedure, risks and complications have been discussed in detail (including, infection, bleeding, need for additional procedures) with the patient, and the patient has signed consent to the procedure. The patient was informed that the wound would be left open.  The patient was taken to OR 8 and general anesthesia was induced. The patient was placed into supine position.  The skin was sterilely prepped and draped over the affected area in the usual fashion. Time out was performed according to the surgical safety checklist.   The area was sounded with a needle to locate the purulent material.  I&D with a #10 blade was performed on the right axillary region distal to the crease.  Cultures were sent. Additional skin was debrided around the opening. Phlegmonous changes were present with thick bloody purulent drainage.  The wound was packed with betadine soaked kerlix.  The patient was awakened from anesthesia and taken to the PACU in stable condition.   Findings:  Bloody purulent fluid  EBL: <25 cc's   Drains: None   Condition: Tolerated procedure well   Complications:  none known.

## 2016-05-17 NOTE — H&P (Signed)
Veronica Moyer is an 21 y.o. female.   Chief Complaint: recurrent right axillary abscess HPI:  Pt is a 21 yo F with numerous superficial areas of right axillary hidradenitis.  Pain was out of proportion to physical exam, so u/s was done.  There was a phlegmon underneath this area.  It is too deep and tender to do in the office.    Past Medical History:  Diagnosis Date  . Anxiety   . Axillary abscess 05/2016   right - currently draining, per pt.  . Bipolar disorder (HCC)   . Depression     Past Surgical History:  Procedure Laterality Date  . IRRIGATION AND DEBRIDEMENT ABSCESS N/A 04/05/2016   Procedure: IRRIGATION AND DEBRIDEMENT RIGHT AXILLARY & LEFT THIGH ABSCESSES;  Surgeon: De BlanchLuke Aaron Kinsinger, MD;  Location: [facility] OR;  Service: General;  Laterality: N/A;    History reviewed. No pertinent family history. Social History:  reports that she has been smoking Cigarettes.  She has smoked for the past 4.00 years. She has never used smokeless tobacco. She reports that she drinks alcohol. She reports that she does not use drugs.  Allergies: No Known Allergies  Medications Prior to Admission  Medication Sig Dispense Refill  . etonogestrel (NEXPLANON) 68 MG IMPL implant 1 each by Subdermal route once.    . lurasidone (LATUDA) 20 MG TABS tablet Take 20 mg by mouth daily.    . sertraline (ZOLOFT) 100 MG tablet Take 100 mg by mouth daily.      No results found for this or any previous visit (from the past 48 hour(s)). No results found.  Review of Systems  All other systems reviewed and are negative.   Height 5\' 1"  (1.549 m), weight 112.5 kg (248 lb). Physical Exam  Constitutional: She is oriented to person, place, and time. She appears well-developed and well-nourished. No distress.  HENT:  Head: Normocephalic and atraumatic.  Eyes: Conjunctivae are normal. Pupils are equal, round, and reactive to light. No scleral icterus.  Neck: Normal range of motion.  Cardiovascular: Normal  rate.   Respiratory: Effort normal.  GI: Soft.  Musculoskeletal: Normal range of motion.  Neurological: She is alert and oriented to person, place, and time. Coordination normal.  Skin: She is not diaphoretic.  Redness and pain in right axilla in location of abscess  Psychiatric: She has a normal mood and affect. Her behavior is normal. Judgment and thought content normal.     Assessment/Plan Deep right axillary abscess secondary to hidradenitis suppurativa  Plan I&D and packing. Reviewed risks.  Almond LintBYERLY,Zamiya Dillard, MD   05/17/2016, 12:51 PM

## 2016-05-17 NOTE — Discharge Instructions (Addendum)
Post Anesthesia Home Care Instructions  Activity: Get plenty of rest for the remainder of the day. A responsible individual must stay with you for 24 hours following the procedure.  For the next 24 hours, DO NOT: -Drive a car -Advertising copywriterperate machinery -Drink alcoholic beverages -Take any medication unless instructed by your physician -Make any legal decisions or sign important papers.  Meals: Start with liquid foods such as gelatin or soup. Progress to regular foods as tolerated. Avoid greasy, spicy, heavy foods. If nausea and/or vomiting occur, drink only clear liquids until the nausea and/or vomiting subsides. Call your physician if vomiting continues.  Special Instructions/Symptoms: Your throat may feel dry or sore from the anesthesia or the breathing tube placed in your throat during surgery. If this causes discomfort, gargle with warm salt water. The discomfort should disappear within 24 hours.  If you had a scopolamine patch placed behind your ear for the management of post- operative nausea and/or vomiting:  1. The medication in the patch is effective for 72 hours, after which it should be removed.  Wrap patch in a tissue and discard in the trash. Wash hands thoroughly with soap and water. 2. You may remove the patch earlier than 72 hours if you experience unpleasant side effects which may include dry mouth, dizziness or visual disturbances. 3. Avoid touching the patch. Wash your hands with soap and water after contact with the patch.       Wound Packing Wound packing involves placing a moistened packing material into your wound and then covering it with an outer bandage (dressing). This helps promote proper healing of deep tissue and tissue under the skin. It also helps prevent bleeding, infection, and further injury. Wounds are packed until deep tissue has had time to heal. The time it takes for this to occur is different for everyone. Your health care provider will show you how to  pack and dress your wound. Using gloves and a sterile technique is important in order to avoid spreading germs into your wound. What are the risks?  Infection.  Delayed or abnormal healing. How to pack your wound Follow your health care provider's instructions on how often you need to change dressings and pack your wound. You will likely be asked to change dressings 1-2 times a day. Supplies Needed   Gloves.  Wetting solution.  Clean bowl.  Packing material (gauze or gauze sponges).  Clean towels.  Outer dressing.  Tape.  Cotton balls or cotton-tipped swabs.  Small plastic bag. Preparing Your New Packing Material   1. Make sure your work surface or countertop is clean and disinfected. 2. Wash your hands well with soap and water. 3. Put a clean towel on the counter. 4. Put a clean bowl on the towel. Be sure to only touch the outside of the bowl when handling it. 5. Pour wetting solution into the bowl. 6. Cut your packing material (gauze or sponges) to the right size for your wound. Drop it into the bowl. 7. Cut four tape strips that you will use to seal the outer dressing. 8. Put cotton balls or cotton-tipped swabs on the clean towel. Removing the Old Packing and Dressing  1. Gently remove the old dressing and packing material. 2. Put the removed items into the plastic bag to throw away later. 3. Wash your hands well with soap and water again. Applying New Packing Material and Dressing  1. Put on your gloves. 2. Squeeze the packing material in the bowl to release excess  liquid. The packing material should be moist, but not dripping wet. 3. Gently place the packing material into the wound. Use a cotton ball or cotton-tipped swab to guide it into place, filling all of the space. 4. Dry your fingertips on the towel. 5. Open up your outer dressing supplies and put them on a dry part of the towel. Keep them from getting wet. 6. Place the dressing over the packed  wound. 7. Tape the four outer edges of the dressing in place. 8. Remove your gloves. 9. Wash your hands again with soap and water. General Tips   Follow your health care provider's instructions on how tightly to pack the wound. At first, the wound will be packed tightly to help stop bleeding. As the wound begins to heal inside, you will use less packing material and pack the wound loosely to allow tissue to heal slowly from the inside out.  Keep the dressing clean and dry.  Follow any other instructions given by your health care provider on how to aid healing. This may include applying warm or cold compresses, elevating the affected area, or wearing a compression dressing.  Ask your health care provider about sun exposure and sunscreen when the dressings are no longer needed.  Keep all follow-up visits with your health care provider. This is important. Contact a health care provider if:  You have drainage, redness, swelling, or pain at your wound site.  You notice a bad smell coming from the wound site.  Your pain is not controlled with pain medicine.  Tissue inside your wound changes color from pink to white, yellow, or black.  Your wound changes in size or depth.  You have a fever.  You have shaking chills.  You are having trouble packing your wound. This information is not intended to replace advice given to you by your health care provider. Make sure you discuss any questions you have with your health care provider. Document Released: 07/29/2013 Document Revised: 07/22/2015 Document Reviewed: 02/25/2013 Elsevier Interactive Patient Education  2017 [employer].

## 2016-05-18 NOTE — Anesthesia Postprocedure Evaluation (Signed)
Anesthesia Post Note  Patient: Veronica JensenKeyana Moyer  Procedure(s) Performed: Procedure(s) (LRB): INCISION AND DRAINAGE AXILLARY ABSCESS (Right)  Patient location during evaluation: PACU Anesthesia Type: General Level of consciousness: awake and alert Pain management: pain level controlled Vital Signs Assessment: post-procedure vital signs reviewed and stable Respiratory status: spontaneous breathing, nonlabored ventilation, respiratory function stable and patient connected to nasal cannula oxygen Cardiovascular status: blood pressure returned to baseline and stable Postop Assessment: no signs of nausea or vomiting Anesthetic complications: no        Last Vitals:  Vitals:   05/17/16 1700 05/17/16 1733  BP: (!) 155/98 (!) 129/56  Pulse: 77 85  Resp: (!) 27 20  Temp:  36.8 C    Last Pain:  Vitals:   05/18/16 0954  TempSrc:   PainSc: 3    Pain Goal: Patients Stated Pain Goal: 3 (05/17/16 1310)               Phillips Groutarignan, Geneva Barrero

## 2016-05-21 ENCOUNTER — Encounter (HOSPITAL_BASED_OUTPATIENT_CLINIC_OR_DEPARTMENT_OTHER): Payer: Self-pay | Admitting: General Surgery

## 2016-05-22 LAB — AEROBIC/ANAEROBIC CULTURE (SURGICAL/DEEP WOUND)

## 2016-05-22 LAB — AEROBIC/ANAEROBIC CULTURE W GRAM STAIN (SURGICAL/DEEP WOUND): Culture: NORMAL

## 2016-08-02 ENCOUNTER — Inpatient Hospital Stay (HOSPITAL_COMMUNITY)
Admission: AD | Admit: 2016-08-02 | Discharge: 2016-08-02 | Disposition: A | Discharge status: Home / Self Care | Payer: Managed Care, Other (non HMO) | Source: Ambulatory Visit | Attending: Obstetrics and Gynecology | Admitting: Obstetrics and Gynecology

## 2016-08-02 ENCOUNTER — Encounter (HOSPITAL_COMMUNITY): Payer: Self-pay | Admitting: *Deleted

## 2016-08-02 DIAGNOSIS — R109 Unspecified abdominal pain: Secondary | ICD-10-CM | POA: Diagnosis present

## 2016-08-02 DIAGNOSIS — K5900 Constipation, unspecified: Secondary | ICD-10-CM

## 2016-08-02 DIAGNOSIS — Z3202 Encounter for pregnancy test, result negative: Secondary | ICD-10-CM | POA: Insufficient documentation

## 2016-08-02 DIAGNOSIS — F17210 Nicotine dependence, cigarettes, uncomplicated: Secondary | ICD-10-CM | POA: Insufficient documentation

## 2016-08-02 DIAGNOSIS — N898 Other specified noninflammatory disorders of vagina: Secondary | ICD-10-CM | POA: Diagnosis not present

## 2016-08-02 LAB — CBC
HEMATOCRIT: 41.2 % (ref 36.0–46.0)
Hemoglobin: 13.8 g/dL (ref 12.0–15.0)
MCH: 31.8 pg (ref 26.0–34.0)
MCHC: 33.5 g/dL (ref 30.0–36.0)
MCV: 94.9 fL (ref 78.0–100.0)
PLATELETS: 280 10*3/uL (ref 150–400)
RBC: 4.34 MIL/uL (ref 3.87–5.11)
RDW: 13.2 % (ref 11.5–15.5)
WBC: 11.1 10*3/uL — AB (ref 4.0–10.5)

## 2016-08-02 LAB — WET PREP, GENITAL
Clue Cells Wet Prep HPF POC: NONE SEEN
Sperm: NONE SEEN
Trich, Wet Prep: NONE SEEN
Yeast Wet Prep HPF POC: NONE SEEN

## 2016-08-02 LAB — URINALYSIS, ROUTINE W REFLEX MICROSCOPIC
Bilirubin Urine: NEGATIVE
GLUCOSE, UA: NEGATIVE mg/dL
HGB URINE DIPSTICK: NEGATIVE
KETONES UR: NEGATIVE mg/dL
NITRITE: NEGATIVE
PH: 6 (ref 5.0–8.0)
PROTEIN: NEGATIVE mg/dL
Specific Gravity, Urine: 1.023 (ref 1.005–1.030)

## 2016-08-02 LAB — POCT PREGNANCY, URINE: Preg Test, Ur: NEGATIVE

## 2016-08-02 MED ORDER — POLYETHYLENE GLYCOL 3350 17 GM/SCOOP PO POWD: 17.0000 g | Freq: Every day | ORAL | 0 refills | Status: DC

## 2016-08-02 NOTE — MAU Note (Signed)
Pt c/o pain in her lower abd for since Saturday. Pt reports she has not had a regualr BM in about 3 weeks. Has had a few small ones but feels like she has to go but can't. C/o nausea off and on as well. Had Implantaid taken out in June ( excerbated skin condition).

## 2016-08-02 NOTE — Discharge Instructions (Signed)

## 2016-08-02 NOTE — MAU Provider Note (Signed)
History     CSN: [id number]  Arrival date and time: 08/02/16 1304   First Provider Initiated Contact with Patient 08/02/16 1351      Chief Complaint  Patient presents with  . Abdominal Pain   Non-pregnant female here with LAP, nausea, fatigue, and constipation x3 weeks. Pain is bilateral, constant and feels "full" but is sharp at times. Rates pain 4/10. She has not used anything for the pain. She used a laxative pill 6 days ago and had a BM the next day that was loose. Prior to that her last BM was 1.5 weeks prior. Denies vomiting. She is eating and drinking well and having 5-6 cups per day. She's had hot flashes but has not checked her temp. Denies urinary sx. Reports increased vaginal discharge x1 week, describes as milky initially then clear today, no odor or itching. No new partner in 8 months and recent STD check was negative. Had Nexplanon removed on July 3 and using condoms inconsistently, would be fine if pregnancy occurred.    Pertinent Gynecological History: Menses: 06/09/16 Contraception: condoms, inconsistently Blood transfusions: none Sexually transmitted diseases: past history: CT Last pap: n/a   Past Medical History:  Diagnosis Date  . Anxiety   . Axillary abscess 05/2016   right - currently draining, per pt.  . Bipolar disorder (HCC)   . Depression     Past Surgical History:  Procedure Laterality Date  . INCISION AND DRAINAGE ABSCESS Right 05/17/2016   Procedure: INCISION AND DRAINAGE AXILLARY ABSCESS;  Surgeon: Almond LintByerly, Faera, MD;  Location: [facility];  Service: General;  Laterality: Right;  . IRRIGATION AND DEBRIDEMENT ABSCESS N/A 04/05/2016   Procedure: IRRIGATION AND DEBRIDEMENT RIGHT AXILLARY & LEFT THIGH ABSCESSES;  Surgeon: De BlanchLuke Aaron Kinsinger, MD;  Location: [facility] OR;  Service: General;  Laterality: N/A;    History reviewed. No pertinent family history.  Social History  Substance Use Topics  . Smoking status: Current Every Day Smoker     Years: 4.00    Types: Cigarettes  . Smokeless tobacco: Never Used     Comment: 1 pack/week  . Alcohol use Yes     Comment: 2 x/month    Allergies: No Known Allergies  Prescriptions Prior to Admission  Medication Sig Dispense Refill Last Dose  . etonogestrel (NEXPLANON) 68 MG IMPL implant 1 each by Subdermal route once.   05/17/2016 at Unknown time  . HYDROcodone-acetaminophen (NORCO) 5-325 MG tablet Take 1-2 tablets by mouth every 6 (six) hours as needed for moderate pain or severe pain. 20 tablet 0   . lurasidone (LATUDA) 20 MG TABS tablet Take 20 mg by mouth daily.   05/16/2016 at Unknown time  . sertraline (ZOLOFT) 100 MG tablet Take 100 mg by mouth daily.   05/16/2016 at Unknown time    Review of Systems  Constitutional: Negative for chills and fever.  Gastrointestinal: Positive for abdominal pain, constipation and nausea. Negative for diarrhea and vomiting.  Genitourinary: Positive for vaginal discharge. Negative for dysuria.   Physical Exam   Blood pressure (!) 149/60, pulse 94, temperature 98.5 F (36.9 C), temperature source Oral, resp. rate 18, height 5\' 1"  (1.549 m), weight 263 lb (119.3 kg), last menstrual period 07/10/2016.  Physical Exam  Constitutional: She is oriented to person, place, and time. She appears well-developed and well-nourished. No distress.  HENT:  Head: Normocephalic and atraumatic.  Neck: Normal range of motion.  Respiratory: Effort normal. No respiratory distress.  GI: Soft. Bowel sounds are normal. She exhibits  no distension. There is tenderness in the right lower quadrant, suprapubic area and left lower quadrant. There is no rebound and no guarding.  Genitourinary:  Genitourinary Comments: External: no lesions or erythema Vagina: rugated, pink, moist, scant thin clear mucous discharge Uterus: non enlarged, anteverted, non tender, no CMT Adnexae: no masses, no tenderness left, no tenderness right   Musculoskeletal: Normal range of motion.   Neurological: She is alert and oriented to person, place, and time.  Skin: Skin is warm and dry.  Psychiatric: She has a normal mood and affect.   Results for orders placed or performed during the hospital encounter of 08/02/16 (from the past 24 hour(s))  Urinalysis, Routine w reflex microscopic     Status: Abnormal   Collection Time: 08/02/16  1:30 PM  Result Value Ref Range   Color, Urine YELLOW YELLOW   APPearance CLEAR CLEAR   Specific Gravity, Urine 1.023 1.005 - 1.030   pH 6.0 5.0 - 8.0   Glucose, UA NEGATIVE NEGATIVE mg/dL   Hgb urine dipstick NEGATIVE NEGATIVE   Bilirubin Urine NEGATIVE NEGATIVE   Ketones, ur NEGATIVE NEGATIVE mg/dL   Protein, ur NEGATIVE NEGATIVE mg/dL   Nitrite NEGATIVE NEGATIVE   Leukocytes, UA TRACE (A) NEGATIVE   RBC / HPF 0-5 0 - 5 RBC/hpf   WBC, UA 0-5 0 - 5 WBC/hpf   Bacteria, UA RARE (A) NONE SEEN   Squamous Epithelial / LPF 0-5 (A) NONE SEEN   Mucous PRESENT   Pregnancy, urine POC     Status: None   Collection Time: 08/02/16  1:49 PM  Result Value Ref Range   Preg Test, Ur NEGATIVE NEGATIVE  Wet prep, genital     Status: Abnormal   Collection Time: 08/02/16  2:10 PM  Result Value Ref Range   Yeast Wet Prep HPF POC NONE SEEN NONE SEEN   Trich, Wet Prep NONE SEEN NONE SEEN   Clue Cells Wet Prep HPF POC NONE SEEN NONE SEEN   WBC, Wet Prep HPF POC FEW (A) NONE SEEN   Sperm NONE SEEN   CBC     Status: Abnormal   Collection Time: 08/02/16  2:35 PM  Result Value Ref Range   WBC 11.1 (H) 4.0 - 10.5 K/uL   RBC 4.34 3.87 - 5.11 MIL/uL   Hemoglobin 13.8 12.0 - 15.0 g/dL   HCT 84.6 96.2 - 95.2 %   MCV 94.9 78.0 - 100.0 fL   MCH 31.8 26.0 - 34.0 pg   MCHC 33.5 30.0 - 36.0 g/dL   RDW 84.1 32.4 - 40.1 %   Platelets 280 150 - 400 K/uL   MAU Course  Procedures  MDM Labs ordered and reviewed. No evidence of acute abdominal or pelvic process. Pain and nausea likely caused by constipation. Recommend Miralax daily and increase water to 5-6 bottles  a day and increased dietary fiber. BP elevated today-recommend PCP follow up. Also recommend PNV daily since not using reliable BC. Stable for discharge home.  Assessment and Plan   1. Constipation, unspecified constipation type   2. Vaginal discharge    Discharge home Establish care with PCP-call insurance for in network list Rx Miralax Start PNV 1 po daily  Allergies as of 08/02/2016   No Known Allergies     Medication List    STOP taking these medications   HYDROcodone-acetaminophen 5-325 MG tablet Commonly known as:  NORCO   NEXPLANON 68 MG Impl implant Generic drug:  etonogestrel     TAKE these  medications   LATUDA 20 MG Tabs tablet Generic drug:  lurasidone Take 20 mg by mouth daily.   polyethylene glycol powder powder Commonly known as:  GLYCOLAX/MIRALAX Take 17 g by mouth daily.   sertraline 100 MG tablet Commonly known as:  ZOLOFT Take 100 mg by mouth daily.      Donette Larry, CNM 08/02/2016, 3:00 PM

## 2016-08-03 LAB — GC/CHLAMYDIA PROBE AMP (~~LOC~~) NOT AT ARMC
Chlamydia: NEGATIVE
NEISSERIA GONORRHEA: NEGATIVE

## 2017-04-12 IMAGING — DX DG CHEST 2V
2 series · 2 of 2 positions shown · non-contrast
Comparison: None.

CLINICAL DATA: Cough for 2 days.

EXAM:
CHEST  2 VIEW

[w chest pa]
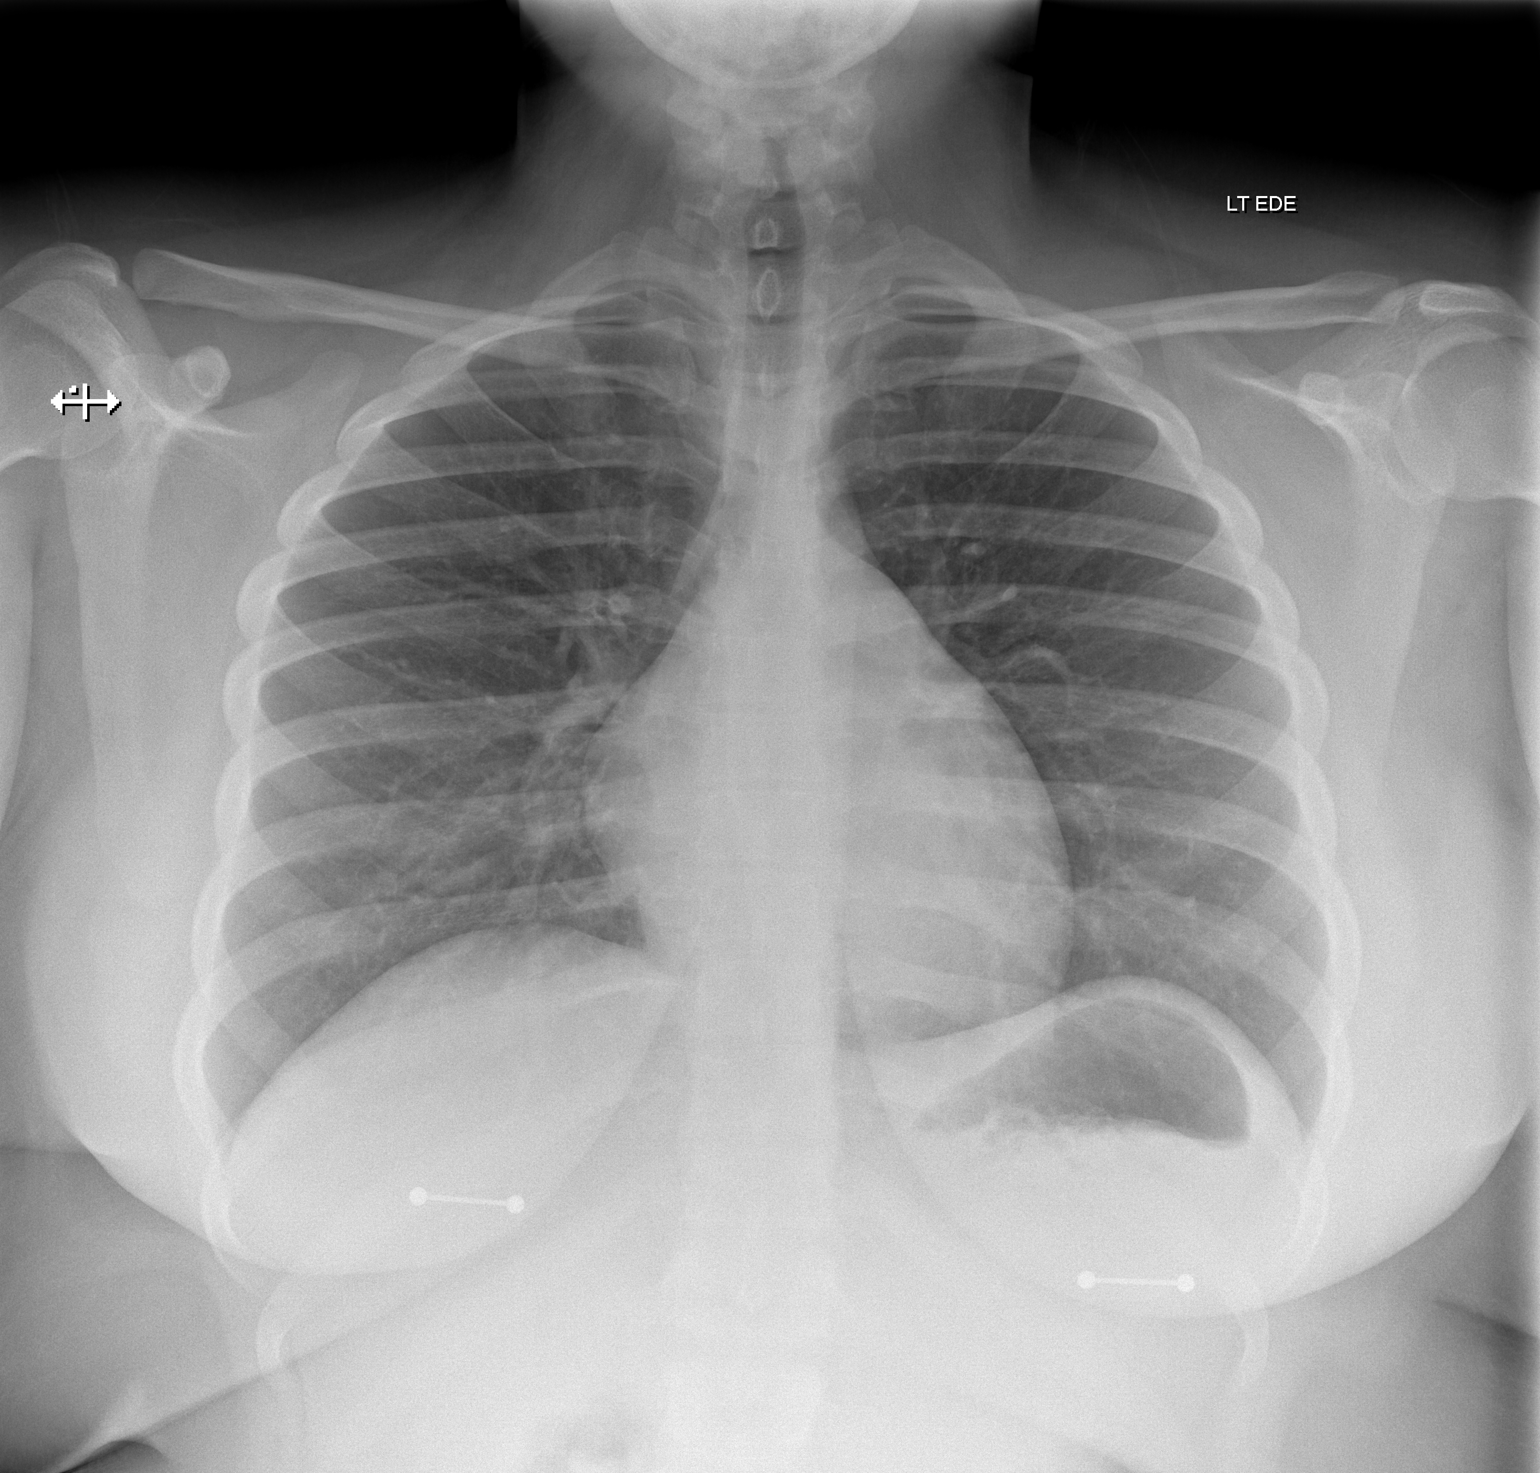

[w chest lat]
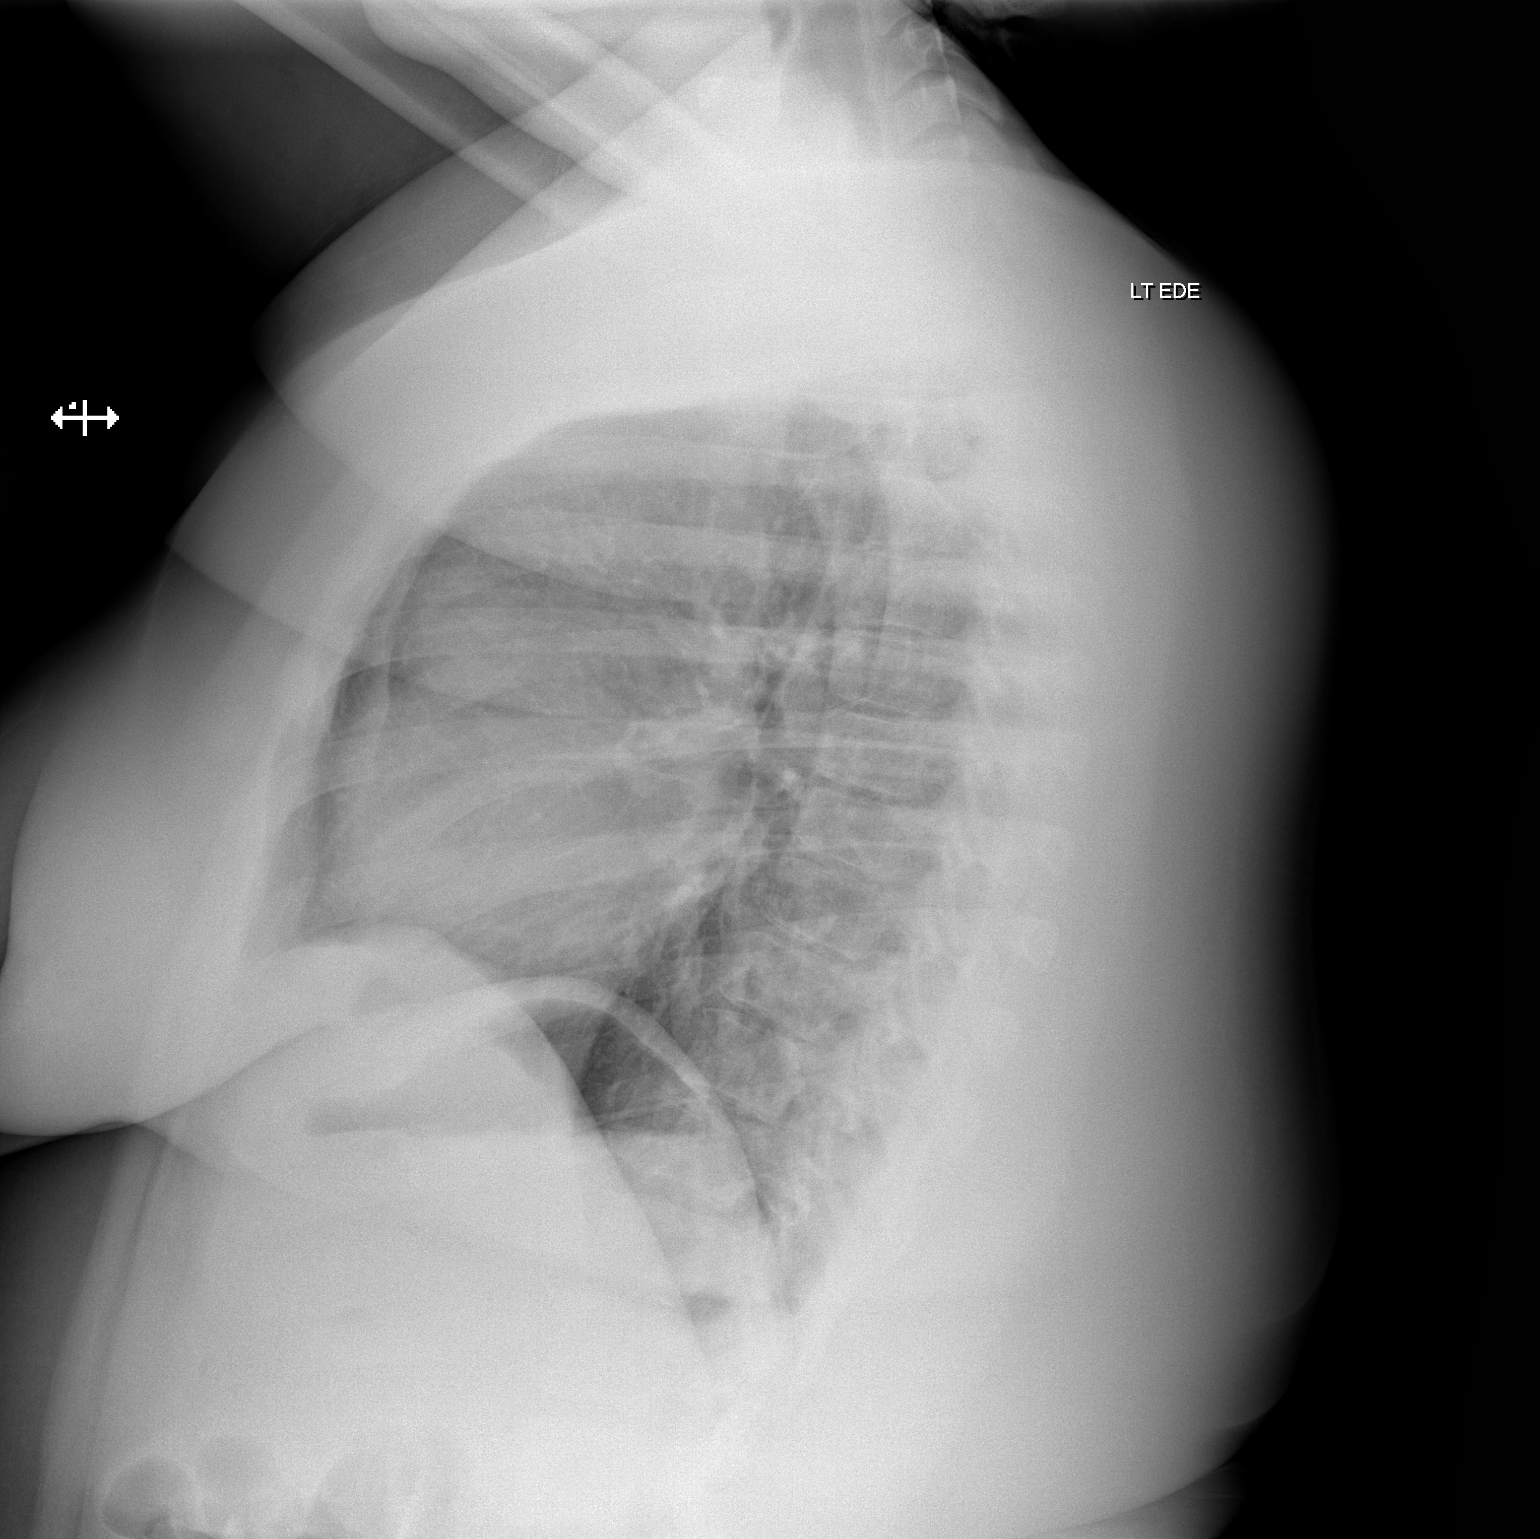

[2 of 2 positions shown; findings below may reference images not displayed]

FINDINGS: The heart size and mediastinal contours are within normal limits.
Both lungs are clear. The visualized skeletal structures are
unremarkable.
IMPRESSION: Normal chest.

## 2022-09-13 ENCOUNTER — Other Ambulatory Visit: Payer: Self-pay

## 2022-09-13 ENCOUNTER — Emergency Department (HOSPITAL_COMMUNITY)
Admission: EM | Admit: 2022-09-13 | Discharge: 2022-09-13 | Disposition: A | Discharge status: Home / Self Care | Payer: BC Managed Care – PPO | Attending: Emergency Medicine | Admitting: Emergency Medicine

## 2022-09-13 ENCOUNTER — Encounter (HOSPITAL_COMMUNITY): Payer: Self-pay

## 2022-09-13 DIAGNOSIS — N611 Abscess of the breast and nipple: Secondary | ICD-10-CM | POA: Diagnosis present

## 2022-09-13 DIAGNOSIS — N61 Mastitis without abscess: Secondary | ICD-10-CM | POA: Insufficient documentation

## 2022-09-13 MED ORDER — CLINDAMYCIN HCL 300 MG PO CAPS
300.0000 mg | ORAL_CAPSULE | Freq: Three times a day (TID) | ORAL | 0 refills | Status: DC
Start: 2022-09-13 — End: 2022-09-14

## 2022-09-13 MED ORDER — CLINDAMYCIN PHOSPHATE 600 MG/4ML IJ SOLN
600.0000 mg | Freq: Once | INTRAMUSCULAR | Status: AC
  Administered 2022-09-13: 600 mg via INTRAMUSCULAR
  Filled 2022-09-13: qty 4

## 2022-09-13 MED ORDER — HYDROCODONE-ACETAMINOPHEN 5-325 MG PO TABS
1.0000 | ORAL_TABLET | ORAL | 0 refills | Status: DC | PRN
Start: 2022-09-13 — End: 2022-12-26

## 2022-09-13 NOTE — ED Provider Notes (Signed)
[place] EMERGENCY DEPARTMENT AT [facility] Provider Note   CSN: [id number] Arrival date & time: 09/13/22  1940     History  Chief Complaint  Patient presents with   Abscess    Veronica Moyer is a 27 y.o. female.  27 year old female with history of at bedtime presents with concern for abscess under her left breast.  Patient first noticed the area a couple days ago.  She reports feeling unwell today.  States in the past she has had to go to the operating room for I&D, does not tolerate bedside I&D.       Home Medications Prior to Admission medications   Medication Sig Start Date End Date Taking? Authorizing Provider  clindamycin (CLEOCIN) 300 MG capsule Take 1 capsule (300 mg total) by mouth 3 (three) times daily for 10 days. 09/13/22 09/23/22 Yes Jeannie Fend, PA-C  HYDROcodone-acetaminophen (NORCO/VICODIN) 5-325 MG tablet Take 1 tablet by mouth every 4 (four) hours as needed. 09/13/22  Yes Jeannie Fend, PA-C  lurasidone (LATUDA) 20 MG TABS tablet Take 20 mg by mouth daily.    [provider]  polyethylene glycol powder (GLYCOLAX/MIRALAX) powder Take 17 g by mouth daily. 08/02/16   Donette Larry, CNM  sertraline (ZOLOFT) 100 MG tablet Take 100 mg by mouth daily.    [provider]      Allergies    Patient has no known allergies.    Review of Systems   Review of Systems Negative except as per HPI Physical Exam Updated Vital Signs BP (!) 146/97   Pulse 100   Temp 98.2 F (36.8 C)   Resp 16   SpO2 100%  Physical Exam Vitals and nursing note reviewed. Exam conducted with a chaperone present.  Constitutional:      General: She is not in acute distress.    Appearance: She is well-developed. She is not diaphoretic.  HENT:     Head: Normocephalic and atraumatic.  Pulmonary:     Effort: Pulmonary effort is normal.  Skin:    Comments: Large area of erythema with induration to left lower breast area, central area of fluctuance, no  drainage  Neurological:     Mental Status: She is alert and oriented to person, place, and time.  Psychiatric:        Behavior: Behavior normal.     ED Results / Procedures / Treatments   Labs (all labs ordered are listed, but only abnormal results are displayed) Labs Reviewed - No data to display  EKG None  Radiology No results found.  Procedures Procedures    Medications Ordered in ED Medications  clindamycin (CLEOCIN) injection 600 mg (has no administration in time range)    ED Course/ Medical Decision Making/ A&P                                 Medical Decision Making  27 year old female with concern for left breast abscess.  History of multiple previously.  Due to large area of cellulitis with central fluctuance concerning for abscess, recommend I&D tonight.  Patient declines and would like to have surgery for this tomorrow.  Advised patient I am unable to arrange for outpatient surgery for her but can refer to local general surgery who has seen her in the past.  Patient is agreeable with this plan.  I did offer to provide medication to help with her pain and anxiety to allow for  bedside I&D attempt, patient declines.  She will be discharged with referral to general surgery, dose of IM clindamycin in the ER with prescription for p.o. clindamycin, prescription for p.o. Norco.  Recommend warm compresses to the area.  Advised return to ER at anytime for any worsening or concerning symptoms or for reevaluation.        Final Clinical Impression(s) / ED Diagnoses Final diagnoses:  Breast abscess  Cellulitis of breast    Rx / DC Orders ED Discharge Orders          Ordered    clindamycin (CLEOCIN) 300 MG capsule  3 times daily        09/13/22 2014    HYDROcodone-acetaminophen (NORCO/VICODIN) 5-325 MG tablet  Every 4 hours PRN        09/13/22 2014              [name] 09/13/22 2027    [name], MD 09/13/22 [phone]

## 2022-09-13 NOTE — ED Triage Notes (Signed)
Un-visualized abscess under left breast.

## 2022-09-13 NOTE — Discharge Instructions (Signed)
Apply warm compresses to the area for 20 minutes at a time. Take antibiotics as prescribed and complete the full course. Take Norco as needed as prescribed for pain.  Do not drive operate machinery while taking this medication. Return to any emergency room at any time for further treatment.  Follow-up with general surgery, provided with phone number to call in the morning.  Also recommend follow-up with general surgery PCP for any breast infection to evaluate for potential for breast cancer.

## 2022-09-14 ENCOUNTER — Emergency Department (HOSPITAL_COMMUNITY)
Admission: EM | Admit: 2022-09-14 | Discharge: 2022-09-15 | Disposition: A | Discharge status: Home / Self Care | Payer: BC Managed Care – PPO | Attending: Emergency Medicine | Admitting: Emergency Medicine

## 2022-09-14 ENCOUNTER — Encounter (HOSPITAL_COMMUNITY): Payer: Self-pay

## 2022-09-14 ENCOUNTER — Other Ambulatory Visit: Payer: Self-pay

## 2022-09-14 DIAGNOSIS — N611 Abscess of the breast and nipple: Secondary | ICD-10-CM | POA: Insufficient documentation

## 2022-09-14 DIAGNOSIS — R000 Tachycardia, unspecified: Secondary | ICD-10-CM | POA: Insufficient documentation

## 2022-09-14 DIAGNOSIS — L0291 Cutaneous abscess, unspecified: Secondary | ICD-10-CM

## 2022-09-14 LAB — CBC WITH DIFFERENTIAL/PLATELET
Abs Immature Granulocytes: 0.02 10*3/uL (ref 0.00–0.07)
Basophils Absolute: 0 10*3/uL (ref 0.0–0.1)
Basophils Relative: 0 %
Eosinophils Absolute: 0.1 10*3/uL (ref 0.0–0.5)
Eosinophils Relative: 1 %
HCT: 42 % (ref 36.0–46.0)
Hemoglobin: 14 g/dL (ref 12.0–15.0)
Immature Granulocytes: 0 %
Lymphocytes Relative: 31 %
Lymphs Abs: 3 10*3/uL (ref 0.7–4.0)
MCH: 31.5 pg (ref 26.0–34.0)
MCHC: 33.3 g/dL (ref 30.0–36.0)
MCV: 94.6 fL (ref 80.0–100.0)
Monocytes Absolute: 0.4 10*3/uL (ref 0.1–1.0)
Monocytes Relative: 4 %
Neutro Abs: 6 10*3/uL (ref 1.7–7.7)
Neutrophils Relative %: 64 %
Platelets: 326 10*3/uL (ref 150–400)
RBC: 4.44 MIL/uL (ref 3.87–5.11)
RDW: 11.8 % (ref 11.5–15.5)
WBC: 9.5 10*3/uL (ref 4.0–10.5)
nRBC: 0 % (ref 0.0–0.2)

## 2022-09-14 LAB — URINALYSIS, W/ REFLEX TO CULTURE (INFECTION SUSPECTED)
Bilirubin Urine: NEGATIVE
Glucose, UA: NEGATIVE mg/dL
Hgb urine dipstick: NEGATIVE
Ketones, ur: NEGATIVE mg/dL
Nitrite: NEGATIVE
Protein, ur: NEGATIVE mg/dL
Specific Gravity, Urine: 1.019 (ref 1.005–1.030)
pH: 5 (ref 5.0–8.0)

## 2022-09-14 LAB — COMPREHENSIVE METABOLIC PANEL
ALT: 24 U/L (ref 0–44)
AST: 18 U/L (ref 15–41)
Albumin: 4 g/dL (ref 3.5–5.0)
Alkaline Phosphatase: 112 U/L (ref 38–126)
Anion gap: 10 (ref 5–15)
BUN: 6 mg/dL (ref 6–20)
CO2: 23 mmol/L (ref 22–32)
Calcium: 9.4 mg/dL (ref 8.9–10.3)
Chloride: 103 mmol/L (ref 98–111)
Creatinine, Ser: 0.7 mg/dL (ref 0.44–1.00)
GFR, Estimated: >60 mL/min (ref >60)
Glucose, Bld: 90 mg/dL (ref 70–99)
Potassium: 3.8 mmol/L (ref 3.5–5.1)
Sodium: 136 mmol/L (ref 135–145)
Total Bilirubin: 0.6 mg/dL (ref 0.3–1.2)
Total Protein: 7.8 g/dL (ref 6.5–8.1)

## 2022-09-14 LAB — I-STAT CG4 LACTIC ACID, ED: Lactic Acid, Venous: 0.6 mmol/L (ref 0.5–1.9)

## 2022-09-14 MED ORDER — FENTANYL CITRATE PF 50 MCG/ML IJ SOSY
50.0000 ug | PREFILLED_SYRINGE | Freq: Once | INTRAMUSCULAR | Status: AC
  Administered 2022-09-14: 50 ug via INTRAVENOUS
  Filled 2022-09-14: qty 1

## 2022-09-14 MED ORDER — PROPOFOL 10 MG/ML IV BOLUS
INTRAVENOUS | Status: AC | PRN
Start: 2022-09-14 — End: 2022-09-14
  Administered 2022-09-14: 20 mg via INTRAVENOUS
  Administered 2022-09-14: 60 mg via INTRAVENOUS

## 2022-09-14 MED ORDER — SODIUM CHLORIDE 0.9 % IV SOLN
INTRAVENOUS | Status: AC | PRN
Start: 2022-09-14 — End: 2022-09-14
  Administered 2022-09-14: 250 mL via INTRAVENOUS

## 2022-09-14 MED ORDER — CLINDAMYCIN HCL 300 MG PO CAPS: 300.0000 mg | ORAL_CAPSULE | Freq: Three times a day (TID) | ORAL | 0 refills | Status: AC

## 2022-09-14 MED ORDER — PROPOFOL 10 MG/ML IV BOLUS: 0.5000 mg/kg | Freq: Once | INTRAVENOUS | Status: DC

## 2022-09-14 MED ORDER — CLINDAMYCIN HCL 150 MG PO CAPS
300.0000 mg | ORAL_CAPSULE | Freq: Once | ORAL | Status: AC
  Administered 2022-09-14: 300 mg via ORAL
  Filled 2022-09-14: qty 2

## 2022-09-14 MED ORDER — HYDROCODONE-ACETAMINOPHEN 5-325 MG PO TABS: 1.0000 | ORAL_TABLET | Freq: Once | ORAL | Status: DC

## 2022-09-14 NOTE — ED Triage Notes (Signed)
Pt came in via POV d/t an abscess under her Lt breast, She was at the surgery center & was unable to tolerate them draining it while in their office. She was sent here requesting a CT & possible consult for surgery (per pt).

## 2022-09-14 NOTE — Discharge Instructions (Addendum)
Continue doing warm compresses to the area.  If the packing is still there in 3 days you can pull it out.  Pick up your antibiotic tomorrow and your pain medication.

## 2022-09-14 NOTE — ED Provider Notes (Signed)
[name] Provider Note   CSN: [id number] Arrival date & time: 09/14/22  1706     History  Chief Complaint  Patient presents with   Abcess under Lt Breast    Veronica Moyer is a 27 y.o. female.  Patient is a 27 year old female with a history of recurrent abscesses who is presenting today with ongoing abscess to the left breast.  It has been present for approximately 2 weeks now and is becoming more painful, indurated and red.  She was seen yesterday but could not tolerate an I&D was discharged with antibiotics which she was not able to pick up because her car broke down.  She did go to the surgical center today and they offered her an I&D in the office but she reports she could not tolerate getting it done.  It has started draining minimally.  She denies systemic symptoms.  The history is provided by the patient and medical records.       Home Medications Prior to Admission medications   Medication Sig Start Date End Date Taking? Authorizing Provider  clindamycin (CLEOCIN) 300 MG capsule Take 1 capsule (300 mg total) by mouth 3 (three) times daily for 7 days. 09/14/22 09/21/22 Yes Demonica Farrey, Alphonzo Lemmings, MD  HYDROcodone-acetaminophen (NORCO/VICODIN) 5-325 MG tablet Take 1 tablet by mouth every 4 (four) hours as needed. Patient not taking: Reported on 09/14/2022 09/13/22   Army Melia A, PA-C  polyethylene glycol powder (GLYCOLAX/MIRALAX) powder Take 17 g by mouth daily. Patient not taking: Reported on 09/14/2022 08/02/16   Donette Larry, CNM      Allergies    Patient has no known allergies.    Review of Systems   Review of Systems  Physical Exam Updated Vital Signs BP (!) 139/90   Pulse 99   Temp 98.9 F (37.2 C)   Resp (!) 21   Ht 5\' 2"  (1.575 m)   Wt 127.9 kg   SpO2 100%   BMI 51.58 kg/m  Physical Exam Vitals and nursing note reviewed.  HENT:     Head: Normocephalic.  Cardiovascular:     Rate and Rhythm: Tachycardia  present.  Pulmonary:     Effort: Pulmonary effort is normal.  Chest:    Neurological:     Mental Status: She is alert.     ED Results / Procedures / Treatments   Labs (all labs ordered are listed, but only abnormal results are displayed) Labs Reviewed  URINALYSIS, W/ REFLEX TO CULTURE (INFECTION SUSPECTED) - Abnormal; Notable for the following components:      Result Value   APPearance CLOUDY (*)    Leukocytes,Ua TRACE (*)    Bacteria, UA RARE (*)    All other components within normal limits  COMPREHENSIVE METABOLIC PANEL  CBC WITH DIFFERENTIAL/PLATELET  I-STAT CG4 LACTIC ACID, ED  I-STAT CG4 LACTIC ACID, ED    EKG None  Radiology No results found.  Procedures .Sedation  Date/Time: 09/14/2022 11:09 PM  Performed by: Gwyneth Sprout, MD Authorized by: Gwyneth Sprout, MD   Consent:    Consent obtained:  Verbal   Consent given by:  Patient   Risks discussed:  Inadequate sedation, nausea, vomiting and respiratory compromise necessitating ventilatory assistance and intubation   Alternatives discussed:  Analgesia without sedation Universal protocol:    Immediately prior to procedure, a time out was called: yes     Patient identity confirmed:  Verbally with patient Indications:    Procedure performed:  Incision and drainage  Procedure necessitating sedation performed by:  Physician performing sedation Pre-sedation assessment:    Time since last food or drink:  5 hours   ASA classification: class 1 - normal, healthy patient     Mouth opening:  3 or more finger widths   Thyromental distance:  4 finger widths   Mallampati score:  III - soft palate, base of uvula visible   Neck mobility: normal     Pre-sedation assessments completed and reviewed: airway patency, cardiovascular function, hydration status, mental status, nausea/vomiting, pain level, respiratory function and temperature     Pre-sedation assessment completed:  09/14/2022 11:10 PM Immediate  pre-procedure details:    Reassessment: Patient reassessed immediately prior to procedure     Reviewed: vital signs     Verified: bag valve mask available, emergency equipment available, intubation equipment available, IV patency confirmed, oxygen available and suction available   Procedure details (see MAR for exact dosages):    Preoxygenation:  Nasal cannula   Sedation:  Propofol   Intended level of sedation: deep   Analgesia:  None   Intra-procedure monitoring:  Blood pressure monitoring, cardiac monitor, continuous capnometry, continuous pulse oximetry, frequent LOC assessments and frequent vital sign checks   Intra-procedure events: none     Total Provider sedation time (minutes):  10 Post-procedure details:    Attendance: Constant attendance by certified staff until patient recovered     Recovery: Patient returned to pre-procedure baseline     Post-sedation assessments completed and reviewed: airway patency, cardiovascular function, hydration status, mental status, nausea/vomiting, pain level, respiratory function and temperature     Patient is stable for discharge or admission: yes     Procedure completion:  Tolerated well, no immediate complications   INCISION AND DRAINAGE Performed by: Gwyneth Sprout Consent: Verbal consent obtained. Risks and benefits: risks, benefits and alternatives were discussed Type: abscess  Body area: under left breast  Anesthesia: local infiltration  Incision was made with a scalpel.  Local anesthetic: lidocaine 2% with epinephrine  Anesthetic total: 4 ml  Complexity: complex Blunt dissection to break up loculations  Drainage: purulent  Drainage amount: scant amt of pus  Packing material: 1/4 in iodoform gauze  Patient tolerance: Patient tolerated the procedure well with no immediate complications.     Medications Ordered in ED Medications  propofol (DIPRIVAN) 10 mg/mL bolus/IV push 64 mg (has no administration in time range)   fentaNYL (SUBLIMAZE) injection 50 mcg (has no administration in time range)  clindamycin (CLEOCIN) capsule 300 mg (has no administration in time range)  propofol (DIPRIVAN) 10 mg/mL bolus/IV push (20 mg Intravenous Given 09/14/22 2259)  0.9 %  sodium chloride infusion (250 mLs Intravenous New Bag/Given 09/14/22 2255)    ED Course/ Medical Decision Making/ A&P                                 Medical Decision Making Amount and/or Complexity of Data Reviewed Labs: ordered.  Risk Prescription drug management.   Patient with abscess to under the left breast.  Minimal drainage.  Patient reports she just cannot tolerate I&D at bedside.  Patient has now been to the surgical office and to the emergency room last night and could not tolerate I&D while awake.  Offered patient ongoing warm compresses, pain control and starting the antibiotics versus propofol sedation and I&D.  Patient is wanting to proceed with sedation to have the I&D done.  Final Clinical Impression(s) / ED Diagnoses Final diagnoses:  Abscess    Rx / DC Orders ED Discharge Orders          Ordered    clindamycin (CLEOCIN) 300 MG capsule  3 times daily        09/14/22 2313              Gwyneth Sprout, MD 09/14/22 2313

## 2022-09-15 MED ORDER — HYDROCODONE-ACETAMINOPHEN 5-325 MG PO TABS
1.0000 | ORAL_TABLET | Freq: Once | ORAL | Status: AC
  Administered 2022-09-15: 1 via ORAL
  Filled 2022-09-15: qty 1

## 2022-09-15 NOTE — ED Notes (Signed)
Propofol not given,prior nurse(Krystal RN) asked for it to be discontinued.

## 2022-12-24 ENCOUNTER — Encounter (HOSPITAL_COMMUNITY): Payer: Self-pay

## 2022-12-24 ENCOUNTER — Emergency Department (HOSPITAL_COMMUNITY): Payer: BC Managed Care – PPO

## 2022-12-24 ENCOUNTER — Observation Stay (HOSPITAL_COMMUNITY)
Admission: EM | Admit: 2022-12-24 | Discharge: 2022-12-26 | Disposition: A | Discharge status: Home / Self Care | Payer: BC Managed Care – PPO | Attending: Surgery | Admitting: Surgery

## 2022-12-24 DIAGNOSIS — L0211 Cutaneous abscess of neck: Secondary | ICD-10-CM | POA: Diagnosis present

## 2022-12-24 DIAGNOSIS — F17210 Nicotine dependence, cigarettes, uncomplicated: Secondary | ICD-10-CM | POA: Insufficient documentation

## 2022-12-24 DIAGNOSIS — L0291 Cutaneous abscess, unspecified: Principal | ICD-10-CM

## 2022-12-24 HISTORY — DX: Polycystic ovarian syndrome: E28.2

## 2022-12-24 LAB — CBC
HCT: 42 % (ref 36.0–46.0)
Hemoglobin: 14.1 g/dL (ref 12.0–15.0)
MCH: 32.2 pg (ref 26.0–34.0)
MCHC: 33.6 g/dL (ref 30.0–36.0)
MCV: 95.9 fL (ref 80.0–100.0)
Platelets: 381 10*3/uL (ref 150–400)
RBC: 4.38 MIL/uL (ref 3.87–5.11)
RDW: 12.2 % (ref 11.5–15.5)
WBC: 10.8 10*3/uL — ABNORMAL HIGH (ref 4.0–10.5)
nRBC: 0 % (ref 0.0–0.2)

## 2022-12-24 LAB — BASIC METABOLIC PANEL
Anion gap: 11 (ref 5–15)
BUN: 5 mg/dL — ABNORMAL LOW (ref 6–20)
CO2: 24 mmol/L (ref 22–32)
Calcium: 9.7 mg/dL (ref 8.9–10.3)
Chloride: 101 mmol/L (ref 98–111)
Creatinine, Ser: 0.7 mg/dL (ref 0.44–1.00)
GFR, Estimated: >60 mL/min (ref >60)
Glucose, Bld: 110 mg/dL — ABNORMAL HIGH (ref 70–99)
Potassium: 3.6 mmol/L (ref 3.5–5.1)
Sodium: 136 mmol/L (ref 135–145)

## 2022-12-24 LAB — PREGNANCY, URINE: Preg Test, Ur: NEGATIVE

## 2022-12-24 MED ORDER — VANCOMYCIN HCL IN DEXTROSE 1-5 GM/200ML-% IV SOLN
1000.0000 mg | Freq: Once | INTRAVENOUS | Status: AC
  Administered 2022-12-24: 1000 mg via INTRAVENOUS
  Filled 2022-12-24: qty 200

## 2022-12-24 MED ORDER — IOHEXOL 350 MG/ML SOLN
75.0000 mL | Freq: Once | INTRAVENOUS | Status: AC | PRN
  Administered 2022-12-24: 75 mL via INTRAVENOUS

## 2022-12-24 MED ORDER — FENTANYL CITRATE PF 50 MCG/ML IJ SOSY
50.0000 ug | PREFILLED_SYRINGE | Freq: Once | INTRAMUSCULAR | Status: AC
  Administered 2022-12-24: 50 ug via INTRAVENOUS
  Filled 2022-12-24: qty 1

## 2022-12-24 MED ORDER — MORPHINE SULFATE (PF) 4 MG/ML IV SOLN
4.0000 mg | Freq: Once | INTRAVENOUS | Status: AC
  Administered 2022-12-24: 4 mg via INTRAVENOUS
  Filled 2022-12-24: qty 1

## 2022-12-24 MED ORDER — ONDANSETRON HCL 4 MG/2ML IJ SOLN
4.0000 mg | Freq: Once | INTRAMUSCULAR | Status: AC
  Administered 2022-12-24: 4 mg via INTRAVENOUS
  Filled 2022-12-24: qty 2

## 2022-12-24 NOTE — Progress Notes (Signed)
ED Pharmacy Antibiotic Sign Off An antibiotic consult was received from an ED provider for vancomycin per pharmacy dosing for  wound ifnection . A chart review was completed to assess appropriateness.  The following one time order(s) were placed per pharmacy consult:  vancomycin 2000 mg x 1 dose  Further antibiotic and/or antibiotic pharmacy consults should be ordered by the admitting provider if indicated.   Thank you for allowing pharmacy to be a part of this patient's care.   Delmar Landau, PharmD, BCPS 12/24/2022 9:22 PM ED Clinical Pharmacist -  [phone]

## 2022-12-24 NOTE — ED Provider Triage Note (Signed)
Emergency Medicine Provider Triage Evaluation Note  Veronica Moyer , a 27 y.o. female  was evaluated in triage. Pt complains of abscess in the back of the neck. Noticed it a week ago. H/o HS. States it radiates up into the right posterior portion of her neck.   Review of Systems  Positive: See above Negative: See above  Physical Exam  BP (!) 121/91 (BP Location: Right Arm)   Pulse 95   Temp 98.8 F (37.1 C) (Oral)   Resp 15   SpO2 99%  Gen:   Awake, no distress   Resp:  Normal effort  MSK:   Moves extremities without difficulty  Other:    Medical Decision Making  Medically screening exam initiated at 3:33 PM.  Appropriate orders placed.  Veronica Moyer was informed that the remainder of the evaluation will be completed by another provider, this initial triage assessment does not replace that evaluation, and the importance of remaining in the ED until their evaluation is complete.  Work up started   Veronica Eagle, PA-C 12/24/22 1536

## 2022-12-24 NOTE — ED Triage Notes (Signed)
Pt has an abscess on the back of her neck, this is not an usual thing to have for her and I the past she has had to receive Iv abx for them. She reports its been on going for a couple weeks and that its progressed to her having fevers, and some shortness of breath. She does not feel well at all and generally lethargic with neck pain from the abscess

## 2022-12-24 NOTE — ED Provider Notes (Signed)
[place] EMERGENCY DEPARTMENT AT [facility] Provider Note   CSN: [id number] Arrival date & time: 12/24/22  1438     History {Add pertinent medical, surgical, social history, OB history to HPI:1} Chief Complaint  Patient presents with   Abscess    Veronica Moyer is a 27 y.o. female.  27 year old female with a past medical history of hidradenitis suppurativa presents here for concerns of abscess to the posterior neck, which developed just over 1 week ago.  Patient states pain associated with the abscess has been progressively worsening.  Abscess has been increasing in size.  She endorses subjective fever as well as chills.  Also having pain that is provoked with movement of her neck.  Endorsing headaches.  She has had something similar in the past, requiring hospital admission for IV antibiotics.  The history is provided by the patient.       Home Medications Prior to Admission medications   Medication Sig Start Date End Date Taking? Authorizing Provider  HYDROcodone-acetaminophen (NORCO/VICODIN) 5-325 MG tablet Take 1 tablet by mouth every 4 (four) hours as needed. Patient not taking: Reported on 09/14/2022 09/13/22   Army Melia A, PA-C  polyethylene glycol powder (GLYCOLAX/MIRALAX) powder Take 17 g by mouth daily. Patient not taking: Reported on 09/14/2022 08/02/16   Donette Larry, CNM      Allergies    Patient has no known allergies.    Review of Systems   As noted in HPI  Physical Exam Updated Vital Signs BP 139/83 (BP Location: Left Arm)   Pulse 100   Temp 98.4 F (36.9 C)   Resp 16   SpO2 100%  Physical Exam Vitals reviewed.  Constitutional:      General: She is not in acute distress.    Appearance: She is obese. She is not ill-appearing, toxic-appearing or diaphoretic.     Comments: Uncomfortable appearing  HENT:     Nose: Nose normal.     Mouth/Throat:     Mouth: Mucous membranes are moist.  Eyes:     Extraocular Movements: Extraocular  movements intact.     Pupils: Pupils are equal, round, and reactive to light.  Cardiovascular:     Rate and Rhythm: Normal rate and regular rhythm.     Heart sounds: Normal heart sounds. No murmur heard.    No friction rub. No gallop.  Pulmonary:     Effort: Pulmonary effort is normal. No respiratory distress.     Breath sounds: Normal breath sounds. No wheezing, rhonchi or rales.  Skin:    General: Skin is warm and dry.  Neurological:     Mental Status: She is alert.     Cranial Nerves: Cranial nerve deficit (Subjective diminished sensation to the right side of the face in V1-3 distribution) present. No dysarthria or facial asymmetry.     Sensory: Sensory deficit (Subjective diffuse diminished sensation throughout right upper extremity when compared to left) present.     Motor: Weakness (4.5/5 strength in right upper extremity secondary to pain) present.     ED Results / Procedures / Treatments   Labs (all labs ordered are listed, but only abnormal results are displayed) Labs Reviewed  BASIC METABOLIC PANEL - Abnormal; Notable for the following components:      Result Value   Glucose, Bld 110 (*)    BUN 5 (*)    All other components within normal limits  CBC - Abnormal; Notable for the following components:   WBC 10.8 (*)  All other components within normal limits  PREGNANCY, URINE    EKG None  Radiology CT Soft Tissue Neck W Contrast  Result Date: 12/24/2022 CLINICAL DATA:  Soft tissue infection suspected, neck, xray done. EXAM: CT NECK WITH CONTRAST TECHNIQUE: Multidetector CT imaging of the neck was performed using the standard protocol following the bolus administration of intravenous contrast. RADIATION DOSE REDUCTION: This exam was performed according to the departmental dose-optimization program which includes automated exposure control, adjustment of the mA and/or kV according to patient size and/or use of iterative reconstruction technique. CONTRAST:  75mL  OMNIPAQUE IOHEXOL 350 MG/ML SOLN COMPARISON:  None Available. FINDINGS: Pharynx and larynx: Normal. No mass or swelling. Salivary glands: No inflammation, mass, or stone. Thyroid: Normal. Lymph nodes: Mildly enlarged right posterior cervical lymph node, likely reactive. Vascular: Normal. Limited intracranial: Unremarkable. Visualized orbits: Normal. Mastoids and visualized paranasal sinuses: Well aerated. Skeleton: Normal. Upper chest: Unremarkable. Other: 3.3 x 3.1 cm thick-walled fluid collection with surrounding inflammation in the posterior neck soft tissues, extending to involve the right dorsal nuchal musculature. IMPRESSION: 3.3 cm abscess in the posterior neck soft tissues, extending to involve the right dorsal nuchal musculature. Electronically Signed   By: Orvan Falconer M.D.   On: 12/24/2022 18:45    Procedures Procedures  {Document cardiac monitor, telemetry assessment procedure when appropriate:1}  Medications Ordered in ED Medications  iohexol (OMNIPAQUE) 350 MG/ML injection 75 mL (75 mLs Intravenous Contrast Given 12/24/22 1727)    ED Course/ Medical Decision Making/ A&P Clinical Course as of 12/24/22 2205  Mon Dec 24, 2022  2146 Has seen [facility] for breast abscess in 08/2022. Saw Dr. Lavell Anchors [JR]    Clinical Course User Index [JR] Rolla Flatten, MD   {   Click here for ABCD2, HEART and other calculatorsREFRESH Note before signing :1}                              Medical Decision Making Risk Prescription drug management.   Patient's presentation is most consistent with {EM COPA:27473}   {Document critical care time when appropriate:1} {Document review of labs and clinical decision tools ie heart score, Chads2Vasc2 etc:1}  {Document your independent review of radiology images, and any outside records:1} {Document your discussion with family members, caretakers, and with consultants:1} {Document social determinants of health affecting pt's  care:1} {Document your decision making why or why not admission, treatments were needed:1} Final Clinical Impression(s) / ED Diagnoses Final diagnoses:  None    Rx / DC Orders ED Discharge Orders     None

## 2022-12-25 ENCOUNTER — Other Ambulatory Visit: Payer: Self-pay

## 2022-12-25 ENCOUNTER — Emergency Department (HOSPITAL_COMMUNITY): Payer: BC Managed Care – PPO

## 2022-12-25 ENCOUNTER — Encounter (HOSPITAL_COMMUNITY): Payer: Self-pay | Admitting: *Deleted

## 2022-12-25 ENCOUNTER — Encounter (HOSPITAL_COMMUNITY)
Admission: EM | Disposition: A | Discharge status: Home / Self Care | Payer: Self-pay | Source: Home / Self Care | Attending: Emergency Medicine

## 2022-12-25 DIAGNOSIS — L0211 Cutaneous abscess of neck: Secondary | ICD-10-CM | POA: Diagnosis present

## 2022-12-25 HISTORY — PX: IRRIGATION AND DEBRIDEMENT ABSCESS: SHX5252

## 2022-12-25 LAB — HIV ANTIBODY (ROUTINE TESTING W REFLEX): HIV Screen 4th Generation wRfx: NONREACTIVE

## 2022-12-25 SURGERY — IRRIGATION AND DEBRIDEMENT ABSCESS
Anesthesia: General | Site: Neck

## 2022-12-25 MED ORDER — CHLORHEXIDINE GLUCONATE 0.12 % MT SOLN
OROMUCOSAL | Status: AC
  Administered 2022-12-25: 15 mL via OROMUCOSAL
  Filled 2022-12-25: qty 15

## 2022-12-25 MED ORDER — ALBUTEROL SULFATE HFA 108 (90 BASE) MCG/ACT IN AERS
INHALATION_SPRAY | RESPIRATORY_TRACT | Status: DC | PRN
  Administered 2022-12-25: 6 via RESPIRATORY_TRACT

## 2022-12-25 MED ORDER — DEXAMETHASONE SODIUM PHOSPHATE 10 MG/ML IJ SOLN
INTRAMUSCULAR | Status: AC
  Filled 2022-12-25: qty 1

## 2022-12-25 MED ORDER — BUPIVACAINE-EPINEPHRINE 0.25% -1:200000 IJ SOLN
INTRAMUSCULAR | Status: DC | PRN
  Administered 2022-12-25: 10 mL

## 2022-12-25 MED ORDER — ACETAMINOPHEN 500 MG PO TABS
ORAL_TABLET | ORAL | Status: AC
  Filled 2022-12-25: qty 2

## 2022-12-25 MED ORDER — PROPOFOL 10 MG/ML IV BOLUS
INTRAVENOUS | Status: DC | PRN
  Administered 2022-12-25: 200 mg via INTRAVENOUS

## 2022-12-25 MED ORDER — METHOCARBAMOL 500 MG PO TABS
500.0000 mg | ORAL_TABLET | Freq: Three times a day (TID) | ORAL | Status: DC | PRN
  Administered 2022-12-25 – 2022-12-26 (×2): 500 mg via ORAL
  Filled 2022-12-25 (×2): qty 1

## 2022-12-25 MED ORDER — BUPIVACAINE-EPINEPHRINE (PF) 0.25% -1:200000 IJ SOLN
INTRAMUSCULAR | Status: AC
  Filled 2022-12-25: qty 30

## 2022-12-25 MED ORDER — CHLORHEXIDINE GLUCONATE 0.12 % MT SOLN: 15.0000 mL | Freq: Once | OROMUCOSAL | Status: AC

## 2022-12-25 MED ORDER — HYDRALAZINE HCL 20 MG/ML IJ SOLN: 10.0000 mg | INTRAMUSCULAR | Status: DC | PRN

## 2022-12-25 MED ORDER — METHOCARBAMOL 1000 MG/10ML IJ SOLN: 500.0000 mg | Freq: Three times a day (TID) | INTRAMUSCULAR | Status: DC | PRN

## 2022-12-25 MED ORDER — FENTANYL CITRATE (PF) 250 MCG/5ML IJ SOLN
INTRAMUSCULAR | Status: AC
  Filled 2022-12-25: qty 5

## 2022-12-25 MED ORDER — DIPHENHYDRAMINE HCL 50 MG/ML IJ SOLN: 25.0000 mg | Freq: Four times a day (QID) | INTRAMUSCULAR | Status: DC | PRN

## 2022-12-25 MED ORDER — FENTANYL CITRATE (PF) 100 MCG/2ML IJ SOLN
INTRAMUSCULAR | Status: AC
  Filled 2022-12-25: qty 2

## 2022-12-25 MED ORDER — FENTANYL CITRATE (PF) 250 MCG/5ML IJ SOLN
INTRAMUSCULAR | Status: DC | PRN
  Administered 2022-12-25: 50 ug via INTRAVENOUS
  Administered 2022-12-25 (×2): 100 ug via INTRAVENOUS

## 2022-12-25 MED ORDER — SUGAMMADEX SODIUM 200 MG/2ML IV SOLN
INTRAVENOUS | Status: DC | PRN
  Administered 2022-12-25: 600 mg via INTRAVENOUS

## 2022-12-25 MED ORDER — LIDOCAINE-EPINEPHRINE (PF) 2 %-1:200000 IJ SOLN
10.0000 mL | Freq: Once | INTRAMUSCULAR | Status: AC
  Administered 2022-12-25: 10 mL
  Filled 2022-12-25: qty 20

## 2022-12-25 MED ORDER — PROPOFOL 10 MG/ML IV BOLUS
INTRAVENOUS | Status: AC
  Filled 2022-12-25: qty 20

## 2022-12-25 MED ORDER — ONDANSETRON HCL 4 MG/2ML IJ SOLN
INTRAMUSCULAR | Status: DC | PRN
  Administered 2022-12-25: 4 mg via INTRAVENOUS

## 2022-12-25 MED ORDER — LIDOCAINE 2% (20 MG/ML) 5 ML SYRINGE
INTRAMUSCULAR | Status: DC | PRN
  Administered 2022-12-25: 100 mg via INTRAVENOUS

## 2022-12-25 MED ORDER — ROCURONIUM BROMIDE 10 MG/ML (PF) SYRINGE
PREFILLED_SYRINGE | INTRAVENOUS | Status: AC
  Filled 2022-12-25: qty 10

## 2022-12-25 MED ORDER — LIDOCAINE 2% (20 MG/ML) 5 ML SYRINGE
INTRAMUSCULAR | Status: AC
  Filled 2022-12-25: qty 5

## 2022-12-25 MED ORDER — 0.9 % SODIUM CHLORIDE (POUR BTL) OPTIME
TOPICAL | Status: DC | PRN
  Administered 2022-12-25: 1000 mL

## 2022-12-25 MED ORDER — DIPHENHYDRAMINE HCL 25 MG PO CAPS: 25.0000 mg | ORAL_CAPSULE | Freq: Four times a day (QID) | ORAL | Status: DC | PRN

## 2022-12-25 MED ORDER — FENTANYL CITRATE (PF) 100 MCG/2ML IJ SOLN
25.0000 ug | INTRAMUSCULAR | Status: DC | PRN
  Administered 2022-12-25 (×3): 50 ug via INTRAVENOUS

## 2022-12-25 MED ORDER — SIMETHICONE 80 MG PO CHEW: 40.0000 mg | CHEWABLE_TABLET | Freq: Four times a day (QID) | ORAL | Status: DC | PRN

## 2022-12-25 MED ORDER — ROCURONIUM BROMIDE 10 MG/ML (PF) SYRINGE
PREFILLED_SYRINGE | INTRAVENOUS | Status: DC | PRN
  Administered 2022-12-25: 50 mg via INTRAVENOUS

## 2022-12-25 MED ORDER — ORAL CARE MOUTH RINSE: 15.0000 mL | Freq: Once | OROMUCOSAL | Status: AC

## 2022-12-25 MED ORDER — OXYCODONE HCL 5 MG PO TABS
5.0000 mg | ORAL_TABLET | ORAL | Status: DC | PRN
  Administered 2022-12-25: 5 mg via ORAL
  Administered 2022-12-25 – 2022-12-26 (×2): 10 mg via ORAL
  Administered 2022-12-26 (×2): 5 mg via ORAL
  Filled 2022-12-25 (×2): qty 1
  Filled 2022-12-25 (×2): qty 2
  Filled 2022-12-25: qty 1

## 2022-12-25 MED ORDER — ACETAMINOPHEN 500 MG PO TABS
1000.0000 mg | ORAL_TABLET | Freq: Once | ORAL | Status: AC
  Administered 2022-12-25: 1000 mg via ORAL

## 2022-12-25 MED ORDER — SODIUM CHLORIDE 0.9 % IV SOLN
INTRAVENOUS | Status: DC
Start: 2022-12-25 — End: 2022-12-25

## 2022-12-25 MED ORDER — ONDANSETRON HCL 4 MG/2ML IJ SOLN
4.0000 mg | Freq: Four times a day (QID) | INTRAMUSCULAR | Status: DC | PRN
  Administered 2022-12-25: 4 mg via INTRAVENOUS
  Filled 2022-12-25: qty 2

## 2022-12-25 MED ORDER — HYDROMORPHONE HCL 1 MG/ML IJ SOLN
0.5000 mg | INTRAMUSCULAR | Status: DC | PRN
  Administered 2022-12-25: 0.5 mg via INTRAVENOUS
  Filled 2022-12-25 (×2): qty 0.5

## 2022-12-25 MED ORDER — MIDAZOLAM HCL 2 MG/2ML IJ SOLN
INTRAMUSCULAR | Status: AC
  Filled 2022-12-25: qty 2

## 2022-12-25 MED ORDER — HYDROMORPHONE HCL 1 MG/ML IJ SOLN
0.5000 mg | Freq: Once | INTRAMUSCULAR | Status: AC
  Administered 2022-12-25: 0.5 mg via INTRAVENOUS
  Filled 2022-12-25: qty 1

## 2022-12-25 MED ORDER — ENOXAPARIN SODIUM 40 MG/0.4ML IJ SOSY
40.0000 mg | PREFILLED_SYRINGE | INTRAMUSCULAR | Status: DC
  Administered 2022-12-26: 40 mg via SUBCUTANEOUS
  Filled 2022-12-25: qty 0.4

## 2022-12-25 MED ORDER — ONDANSETRON HCL 4 MG/2ML IJ SOLN
INTRAMUSCULAR | Status: AC
  Filled 2022-12-25: qty 2

## 2022-12-25 MED ORDER — DOCUSATE SODIUM 100 MG PO CAPS
100.0000 mg | ORAL_CAPSULE | Freq: Two times a day (BID) | ORAL | Status: DC
  Administered 2022-12-26: 100 mg via ORAL
  Filled 2022-12-25: qty 1

## 2022-12-25 MED ORDER — MIDAZOLAM HCL 2 MG/2ML IJ SOLN
INTRAMUSCULAR | Status: DC | PRN
  Administered 2022-12-25: 2 mg via INTRAVENOUS

## 2022-12-25 MED ORDER — ACETAMINOPHEN 500 MG PO TABS: 1000.0000 mg | ORAL_TABLET | Freq: Four times a day (QID) | ORAL | Status: DC | PRN

## 2022-12-25 MED ORDER — POLYETHYLENE GLYCOL 3350 17 G PO PACK
17.0000 g | PACK | Freq: Every day | ORAL | Status: DC | PRN
Start: 2022-12-25 — End: 2022-12-26

## 2022-12-25 MED ORDER — MORPHINE SULFATE (PF) 4 MG/ML IV SOLN
4.0000 mg | Freq: Once | INTRAVENOUS | Status: AC
  Administered 2022-12-25: 4 mg via INTRAVENOUS
  Filled 2022-12-25: qty 1

## 2022-12-25 SURGICAL SUPPLY — 23 items
BAG COUNTER SPONGE SURGICOUNT (BAG) ×1 IMPLANT
BLADE CLIPPER SURG (BLADE) IMPLANT
BNDG GAUZE DERMACEA FLUFF 4 (GAUZE/BANDAGES/DRESSINGS) IMPLANT
CANISTER SUCT 3000ML PPV (MISCELLANEOUS) ×1 IMPLANT
COVER SURGICAL LIGHT HANDLE (MISCELLANEOUS) ×1 IMPLANT
DRAPE LAPAROTOMY 100X72 PEDS (DRAPES) IMPLANT
ELECT CAUTERY BLADE 6.4 (BLADE) IMPLANT
ELECT REM PT RETURN 9FT ADLT (ELECTROSURGICAL) ×1
ELECTRODE REM PT RTRN 9FT ADLT (ELECTROSURGICAL) ×1 IMPLANT
GAUZE PAD ABD 8X10 STRL (GAUZE/BANDAGES/DRESSINGS) IMPLANT
GAUZE SPONGE 4X4 12PLY STRL (GAUZE/BANDAGES/DRESSINGS) IMPLANT
GLOVE BIO SURGEON STRL SZ7 (GLOVE) ×1 IMPLANT
GLOVE BIOGEL PI IND STRL 7.5 (GLOVE) ×1 IMPLANT
GOWN STRL REUS W/ TWL LRG LVL3 (GOWN DISPOSABLE) ×2 IMPLANT
KIT BASIN OR (CUSTOM PROCEDURE TRAY) ×1 IMPLANT
KIT TURNOVER KIT B (KITS) ×1 IMPLANT
NS IRRIG 1000ML POUR BTL (IV SOLUTION) ×1 IMPLANT
PACK GENERAL/GYN (CUSTOM PROCEDURE TRAY) ×1 IMPLANT
PAD ARMBOARD 7.5X6 YLW CONV (MISCELLANEOUS) ×1 IMPLANT
SWAB COLLECTION DEVICE MRSA (MISCELLANEOUS) IMPLANT
SWAB CULTURE ESWAB REG 1ML (MISCELLANEOUS) IMPLANT
TOWEL GREEN STERILE (TOWEL DISPOSABLE) ×1 IMPLANT
TOWEL GREEN STERILE FF (TOWEL DISPOSABLE) ×1 IMPLANT

## 2022-12-25 NOTE — H&P (Signed)
Reason for Consult/Chief Complaint: posterior neck abscess Consultant: Doran Durand, MD  Veronica Moyer is an 27 y.o. female.   HPI: 21F with fluid collection of posterior neck that has been enlarging. Associated pain, n/v/f. Denies drainage.   Past Medical History:  Diagnosis Date   Anxiety    Axillary abscess 05/2016   right - currently draining, per pt.   Bipolar disorder (HCC)    Depression     Past Surgical History:  Procedure Laterality Date   INCISION AND DRAINAGE ABSCESS Right 05/17/2016   Procedure: INCISION AND DRAINAGE AXILLARY ABSCESS;  Surgeon: Almond Lint, MD;  Location: [facility];  Service: General;  Laterality: Right;   IRRIGATION AND DEBRIDEMENT ABSCESS N/A 04/05/2016   Procedure: IRRIGATION AND DEBRIDEMENT RIGHT AXILLARY & LEFT THIGH ABSCESSES;  Surgeon: Rodman Pickle, MD;  Location: [facility] OR;  Service: General;  Laterality: N/A;    History reviewed. No pertinent family history.  Social History:  reports that she has been smoking cigarettes. She has never used smokeless tobacco. She reports current alcohol use. She reports that she does not use drugs.  Allergies:  Allergies  Allergen Reactions   Wound Dressing Adhesive Rash    Medications: I have reviewed the patient's current medications.  Results for orders placed or performed during the hospital encounter of 12/24/22 (from the past 48 hour(s))  Basic metabolic panel     Status: Abnormal   Collection Time: 12/24/22  3:50 PM  Result Value Ref Range   Sodium 136 135 - 145 mmol/L   Potassium 3.6 3.5 - 5.1 mmol/L   Chloride 101 98 - 111 mmol/L   CO2 24 22 - 32 mmol/L   Glucose, Bld 110 (H) 70 - 99 mg/dL    Comment: Glucose reference range applies only to samples taken after fasting for at least 8 hours.   BUN 5 (L) 6 - 20 mg/dL   Creatinine, Ser 1.61 0.44 - 1.00 mg/dL   Calcium 9.7 8.9 - 09.6 mg/dL   GFR, Estimated >04 >54 mL/min    Comment: (NOTE) Calculated using the  CKD-EPI Creatinine Equation (2021)    Anion gap 11 5 - 15    Comment: Performed at [facility], [address], [place], [place] [address]  CBC     Status: Abnormal   Collection Time: 12/24/22  3:50 PM  Result Value Ref Range   WBC 10.8 (H) 4.0 - 10.5 K/uL   RBC 4.38 3.87 - 5.11 MIL/uL   Hemoglobin 14.1 12.0 - 15.0 g/dL   HCT 91.4 78.2 - 95.6 %   MCV 95.9 80.0 - 100.0 fL   MCH 32.2 26.0 - 34.0 pg   MCHC 33.6 30.0 - 36.0 g/dL   RDW 21.3 08.6 - 57.8 %   Platelets 381 150 - 400 K/uL   nRBC 0.0 0.0 - 0.2 %    Comment: Performed at [facility], [address], [place], [place] [address]  Pregnancy, urine     Status: None   Collection Time: 12/24/22  9:07 PM  Result Value Ref Range   Preg Test, Ur NEGATIVE NEGATIVE    Comment:        THE SENSITIVITY OF THIS METHODOLOGY IS >25 mIU/mL. Performed at [facility], [address], [place], [place] [address]     CT Soft Tissue Neck W Contrast  Result Date: 12/24/2022 CLINICAL DATA:  Soft tissue infection suspected, neck, xray done. EXAM: CT NECK WITH CONTRAST TECHNIQUE:  Multidetector CT imaging of the neck was performed using the standard protocol following the bolus administration of intravenous contrast. RADIATION DOSE REDUCTION: This exam was performed according to the departmental dose-optimization program which includes automated exposure control, adjustment of the mA and/or kV according to patient size and/or use of iterative reconstruction technique. CONTRAST:  75mL OMNIPAQUE IOHEXOL 350 MG/ML SOLN COMPARISON:  None Available. FINDINGS: Pharynx and larynx: Normal. No mass or swelling. Salivary glands: No inflammation, mass, or stone. Thyroid: Normal. Lymph nodes: Mildly enlarged right posterior cervical lymph node, likely reactive. Vascular: Normal. Limited intracranial: Unremarkable. Visualized orbits: Normal. Mastoids and visualized paranasal sinuses: Well aerated. Skeleton: Normal. Upper chest: Unremarkable. Other:  3.3 x 3.1 cm thick-walled fluid collection with surrounding inflammation in the posterior neck soft tissues, extending to involve the right dorsal nuchal musculature. IMPRESSION: 3.3 cm abscess in the posterior neck soft tissues, extending to involve the right dorsal nuchal musculature. Electronically Signed   By: Orvan Falconer M.D.   On: 12/24/2022 18:45    ROS 10 point review of systems is negative except as listed above in HPI.   Physical Exam Blood pressure 132/83, pulse 84, temperature 98.8 F (37.1 C), temperature source Oral, resp. rate 17, SpO2 100%. Constitutional: well-developed, well-nourished HEENT: pupils equal, round, reactive to light, 2mm b/l, moist conjunctiva, external inspection of ears and nose normal, hearing intact Oropharynx: normal oropharyngeal mucosa, normal dentition Neck: no thyromegaly, trachea midline, TTP of posterior neck with area of ulceration, no drainage Chest: breath sounds equal bilaterally, normal respiratory effort, no midline or lateral chest wall tenderness to palpation/deformity Abdomen: soft, NT, no bruising, no hepatosplenomegaly Skin: warm, dry, no rashes Psych: normal memory, normal mood/affect     Assessment/Plan: Posterior neck abscess - recommend I&D for culture data. Patient refusing to allow me to I&D at bedside without sedation.  FEN - strict NPO DVT - SCDs Dispo -  I&D in OR later this AM, likely home post-op     Diamantina Monks, MD General and Trauma Surgery [facility] Surgery

## 2022-12-25 NOTE — Interval H&P Note (Signed)
History and Physical Interval Note:  12/25/2022 8:22 AM  Veronica Moyer  has presented today for surgery, with the diagnosis of neck abscess.  The various methods of treatment have been discussed with the patient and family. After consideration of risks, benefits and other options for treatment, the patient has consented to  Procedure(s): IRRIGATION AND DEBRIDEMENT  OF POSTERIOR NECK ABSCESS (N/A) as a surgical intervention.  The patient's history has been reviewed, patient examined, no change in status, stable for surgery.  I have reviewed the patient's chart and labs.  Questions were answered to the patient's satisfaction.     Wynona Luna

## 2022-12-25 NOTE — Anesthesia Preprocedure Evaluation (Addendum)
Anesthesia Evaluation  Patient identified by MRN, date of birth, ID band Patient awake    Reviewed: Allergy & Precautions, NPO status , Patient's Chart, lab work & pertinent test results  Airway Mallampati: I  TM Distance: >3 FB Neck ROM: Full    Dental  (+) Dental Advisory Given, Chipped,    Pulmonary Patient abstained from smoking., former smoker   Pulmonary exam normal breath sounds clear to auscultation       Cardiovascular negative cardio ROS Normal cardiovascular exam Rhythm:Regular Rate:Normal     Neuro/Psych  PSYCHIATRIC DISORDERS Anxiety Depression Bipolar Disorder   negative neurological ROS     GI/Hepatic negative GI ROS, Neg liver ROS,,,  Endo/Other    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  Renal/GU negative Renal ROS  negative genitourinary   Musculoskeletal negative musculoskeletal ROS (+)    Abdominal   Peds  Hematology negative hematology ROS (+)   Anesthesia Other Findings   Reproductive/Obstetrics                             Anesthesia Physical Anesthesia Plan  ASA: 3  Anesthesia Plan: General   Post-op Pain Management: Tylenol PO (pre-op)*   Induction: Intravenous  PONV Risk Score and Plan: 3 and Midazolam, Dexamethasone and Ondansetron  Airway Management Planned: Oral ETT  Additional Equipment:   Intra-op Plan:   Post-operative Plan: Extubation in OR  Informed Consent: I have reviewed the patients History and Physical, chart, labs and discussed the procedure including the risks, benefits and alternatives for the proposed anesthesia with the patient or authorized representative who has indicated his/her understanding and acceptance.     Dental advisory given  Plan Discussed with: CRNA  Anesthesia Plan Comments:        Anesthesia Quick Evaluation

## 2022-12-25 NOTE — ED Notes (Signed)
Report called to OR, pt to go to [facility].

## 2022-12-25 NOTE — Anesthesia Procedure Notes (Signed)
Procedure Name: Intubation Date/Time: 12/25/2022 12:05 PM  Performed by: Kayleen Memos, CRNAPre-anesthesia Checklist: Patient identified, Emergency Drugs available, Suction available and Patient being monitored Patient Re-evaluated:Patient Re-evaluated prior to induction Oxygen Delivery Method: Circle System Utilized Preoxygenation: Pre-oxygenation with 100% oxygen Induction Type: IV induction Ventilation: Mask ventilation without difficulty Laryngoscope Size: Mac and 3 Grade View: Grade II Tube type: Oral Tube size: 7.0 mm Number of attempts: 1 Airway Equipment and Method: Stylet Placement Confirmation: ETT inserted through vocal cords under direct vision, positive ETCO2 and breath sounds checked- equal and bilateral Secured at: 22 cm Tube secured with: Tape Dental Injury: Teeth and Oropharynx as per pre-operative assessment

## 2022-12-25 NOTE — Transfer of Care (Signed)
Immediate Anesthesia Transfer of Care Note  Patient: Veronica Moyer  Procedure(s) Performed: IRRIGATION AND DEBRIDEMENT  OF POSTERIOR NECK ABSCESS (Neck)  Patient Location: PACU  Anesthesia Type:General  Level of Consciousness: awake, oriented, and drowsy  Airway & Oxygen Therapy: Patient Spontanous Breathing  Post-op Assessment: Report given to RN, Post -op Vital signs reviewed and stable, and Patient moving all extremities  Post vital signs: Reviewed and stable  Last Vitals:  Vitals Value Taken Time  BP 142/95 12/25/22 1247  Temp    Pulse 106 12/25/22 1251  Resp 19 12/25/22 1251  SpO2 95 % 12/25/22 1251  Vitals shown include unfiled device data.  Last Pain:  Vitals:   12/25/22 1122  TempSrc: Oral  PainSc: 6          Complications: No notable events documented.

## 2022-12-25 NOTE — Plan of Care (Signed)
  Problem: Education: Goal: Knowledge of General Education information will improve Description: Including pain rating scale, medication(s)/side effects and non-pharmacologic comfort measures Outcome: Progressing   Problem: Health Behavior/Discharge Planning: Goal: Ability to manage health-related needs will improve Outcome: Progressing   Problem: Clinical Measurements: Goal: Ability to maintain clinical measurements within normal limits will improve Outcome: Progressing Goal: Will remain free from infection Outcome: Progressing Goal: Diagnostic test results will improve Outcome: Progressing Goal: Respiratory complications will improve Outcome: Progressing Goal: Cardiovascular complication will be avoided Outcome: Progressing   Problem: Activity: Goal: Risk for activity intolerance will decrease Outcome: Progressing   Problem: Nutrition: Goal: Adequate nutrition will be maintained Outcome: Progressing   Problem: Coping: Goal: Level of anxiety will decrease Outcome: Progressing   Problem: Elimination: Goal: Will not experience complications related to bowel motility Outcome: Progressing Goal: Will not experience complications related to urinary retention Outcome: Progressing   Problem: Pain Management: Goal: General experience of comfort will improve Outcome: Not Progressing Note: Post surgical lots of pain   Problem: Safety: Goal: Ability to remain free from injury will improve Outcome: Progressing   Problem: Skin Integrity: Goal: Risk for impaired skin integrity will decrease Outcome: Progressing

## 2022-12-25 NOTE — Anesthesia Postprocedure Evaluation (Signed)
Anesthesia Post Note  Patient: Veronica Moyer  Procedure(s) Performed: IRRIGATION AND DEBRIDEMENT  OF POSTERIOR NECK ABSCESS (Neck)     Patient location during evaluation: PACU Anesthesia Type: General Level of consciousness: awake and alert Pain management: pain level controlled Vital Signs Assessment: post-procedure vital signs reviewed and stable Respiratory status: spontaneous breathing, nonlabored ventilation, respiratory function stable and patient connected to nasal cannula oxygen Cardiovascular status: blood pressure returned to baseline and stable Postop Assessment: no apparent nausea or vomiting Anesthetic complications: no  No notable events documented.  Last Vitals:  Vitals:   12/25/22 1400 12/25/22 1410  BP: 124/70 124/70  Pulse: 79 79  Resp: 16   Temp: 36.8 C 36.8 C  SpO2: 100% 100%    Last Pain:  Vitals:   12/25/22 1453  TempSrc:   PainSc: 10-Worst pain ever                 Bobak Oguinn L Fatemah Pourciau

## 2022-12-25 NOTE — Plan of Care (Signed)
  Problem: Education: Goal: Knowledge of General Education information will improve Description: Including pain rating scale, medication(s)/side effects and non-pharmacologic comfort measures 12/25/2022 1528 by Aretta Nip, RN Outcome: Progressing 12/25/2022 1424 by Aretta Nip, RN Outcome: Progressing   Problem: Health Behavior/Discharge Planning: Goal: Ability to manage health-related needs will improve 12/25/2022 1528 by Aretta Nip, RN Outcome: Progressing 12/25/2022 1424 by Aretta Nip, RN Outcome: Progressing   Problem: Clinical Measurements: Goal: Ability to maintain clinical measurements within normal limits will improve 12/25/2022 1528 by Aretta Nip, RN Outcome: Progressing 12/25/2022 1424 by Aretta Nip, RN Outcome: Progressing Goal: Will remain free from infection 12/25/2022 1528 by Aretta Nip, RN Outcome: Progressing 12/25/2022 1424 by Aretta Nip, RN Outcome: Progressing Goal: Diagnostic test results will improve 12/25/2022 1528 by Aretta Nip, RN Outcome: Progressing 12/25/2022 1424 by Aretta Nip, RN Outcome: Progressing Goal: Respiratory complications will improve 12/25/2022 1528 by Aretta Nip, RN Outcome: Progressing 12/25/2022 1424 by Aretta Nip, RN Outcome: Progressing Goal: Cardiovascular complication will be avoided 12/25/2022 1528 by Aretta Nip, RN Outcome: Progressing 12/25/2022 1424 by Aretta Nip, RN Outcome: Progressing   Problem: Activity: Goal: Risk for activity intolerance will decrease 12/25/2022 1528 by Aretta Nip, RN Outcome: Progressing 12/25/2022 1424 by Aretta Nip, RN Outcome: Progressing   Problem: Nutrition: Goal: Adequate nutrition will be maintained 12/25/2022 1528 by Aretta Nip, RN Outcome: Progressing 12/25/2022 1424 by Aretta Nip, RN Outcome: Progressing   Problem: Coping: Goal: Level of anxiety will decrease 12/25/2022 1528 by Aretta Nip, RN Outcome: Progressing 12/25/2022 1424 by Aretta Nip, RN Outcome: Progressing   Problem: Elimination: Goal: Will not experience complications related to bowel motility 12/25/2022 1528 by Aretta Nip, RN Outcome: Progressing 12/25/2022 1424 by Aretta Nip, RN Outcome: Progressing Goal: Will not experience complications related to urinary retention 12/25/2022 1528 by Aretta Nip, RN Outcome: Progressing 12/25/2022 1424 by Aretta Nip, RN Outcome: Progressing   Problem: Pain Management: Goal: General experience of comfort will improve 12/25/2022 1528 by Aretta Nip, RN Outcome: Not Progressing 12/25/2022 1424 by Aretta Nip, RN Outcome: Not Progressing Note: Post surgical lots of pain   Problem: Safety: Goal: Ability to remain free from injury will improve 12/25/2022 1528 by Aretta Nip, RN Outcome: Progressing 12/25/2022 1424 by Aretta Nip, RN Outcome: Progressing   Problem: Skin Integrity: Goal: Risk for impaired skin integrity will decrease 12/25/2022 1528 by Aretta Nip, RN Outcome: Not Progressing 12/25/2022 1424 by Aretta Nip, RN Outcome: Progressing

## 2022-12-25 NOTE — ED Provider Notes (Signed)
Patient signed out to me pending gen surg eval.  Patient has received abx in the ED. Will need admission.  Whether to gen surg or medicine is contingent upon eval by Dr. Bedelia Person.  I've reassessed the patient and she appears stable.  Signed out to the morning team pending eval from surgery.  I have been in communication with Dr. Bedelia Person, who is going to attempt bedside I&D, but might end up taking patient to OR.  TBD.     Roxy Horseman, PA-C 12/25/22 1610    Gilda Crease, MD 12/26/22 [phone]

## 2022-12-25 NOTE — Progress Notes (Signed)
1400 patient arrived to unit alert x4 on room air complaining of neck pain tearful.

## 2022-12-25 NOTE — ED Provider Notes (Signed)
Handoff from R. Browning PA, pending surgery eval for disposition, possible I+D bedside vs OR.   Procedures  Procedures  ED Course / MDM   Clinical Course as of 12/25/22 1610  [facility] Dec 24, 2022  2146 Has seen [facility] for breast abscess in 08/2022. Saw Dr. Lavell Anchors [JR]    Clinical Course User Index [JR] Rolla Flatten, MD   Medical Decision Making Messaged with Dr. Bedelia Person, general surgery, patient apparently refused for I&D to be done at bedside, day team to assume care.  Spoke with PA general surgery, Veronica Moyer, and she states that plan is for patient to go to the OR, with Dr. Corliss Skains for I&D, and likely discharge from PACU.  General surgery to assume care of patient as she is going to OR  Risk Prescription drug management. Decision regarding hospitalization.          Pete Pelt, [place] 12/25/22 1430    Lonell Grandchild, MD 12/25/22 [phone]

## 2022-12-25 NOTE — Op Note (Signed)
Pre-op diagnosis: Deep subcutaneous abscess posterior neck Postop diagnosis: Deep subcutaneous abscess posterior neck (3.5 cm) Procedure performed: Incision and drainage of posterior neck abscess Surgeon:Kellin Bartling K Lin Glazier Anesthesia: General Indications: This is a 27 year old female with a history of hidradenitis who presents with a 1 week history of a slowly developing infection and her posterior neck.  She presented to the emergency department for evaluation.  She had a 3.3 cm abscess cavity noted.  She presents to the operating room for drainage of this abscess.  Description of procedure: The patient is brought to the operating room placed in the supine position on the stretcher.  After an adequate level general anesthesia was obtained, she was moved to a prone position on the operating room table.  We prepped the area over the abscess cavity and her lower hairline with Betadine and draped sterile fashion.  A timeout was taken to ensure the proper patient and proper procedure.  We infiltrated local anesthetic around the abscess.  I made a transverse incision and we dissected down through a considerable thickness of dermis and subcutaneous tissue until we encountered the abscess.  I excised a 2 cm circle of skin over this area.  We debrided the anterior wall of the abscess cavity.  The entire cavity measured 3.5 cm in diameter.  We irrigated thoroughly and inspected for hemostasis.  I placed 1/4 inch Penrose drain deep into the abscess cavity and secured this with interrupted 2-0 Ethilon sutures.  The superficial part of the wound was packed with saline moistened gauze.  A dry dressing is applied.  The patient was then moved back to a supine position.  She was extubated and brought to the recovery room in stable condition.  All sponge, instrument, and needle counts are correct.  Wilmon Arms. Corliss Skains, MD, [facility]  General Surgery   12/25/2022 12:36 PM

## 2022-12-26 ENCOUNTER — Other Ambulatory Visit (HOSPITAL_COMMUNITY): Payer: Self-pay

## 2022-12-26 ENCOUNTER — Encounter (HOSPITAL_COMMUNITY): Payer: Self-pay | Admitting: Surgery

## 2022-12-26 LAB — SURGICAL PATHOLOGY

## 2022-12-26 MED ORDER — ONDANSETRON HCL 4 MG PO TABS
4.0000 mg | ORAL_TABLET | Freq: Three times a day (TID) | ORAL | 1 refills | Status: DC | PRN
  Filled 2022-12-26: qty 20, 7d supply, fill #0

## 2022-12-26 MED ORDER — IBUPROFEN 600 MG PO TABS
600.0000 mg | ORAL_TABLET | Freq: Three times a day (TID) | ORAL | 0 refills | Status: AC | PRN
  Filled 2022-12-26: qty 30, 10d supply, fill #0

## 2022-12-26 MED ORDER — OXYCODONE HCL 5 MG PO TABS
5.0000 mg | ORAL_TABLET | Freq: Three times a day (TID) | ORAL | 0 refills | Status: DC | PRN
  Filled 2022-12-26: qty 15, 5d supply, fill #0

## 2022-12-26 MED ORDER — ACETAMINOPHEN 500 MG PO TABS
1000.0000 mg | ORAL_TABLET | Freq: Three times a day (TID) | ORAL | Status: DC
  Administered 2022-12-26: 1000 mg via ORAL
  Filled 2022-12-26: qty 2

## 2022-12-26 MED ORDER — IBUPROFEN 600 MG PO TABS: 600.0000 mg | ORAL_TABLET | Freq: Three times a day (TID) | ORAL | 0 refills | Status: DC | PRN

## 2022-12-26 MED ORDER — ACETAMINOPHEN 500 MG PO TABS: 500.0000 mg | ORAL_TABLET | Freq: Four times a day (QID) | ORAL | 0 refills | Status: DC | PRN

## 2022-12-26 MED ORDER — DOCUSATE SODIUM 100 MG PO CAPS: 100.0000 mg | ORAL_CAPSULE | Freq: Two times a day (BID) | ORAL | 0 refills | Status: DC

## 2022-12-26 MED ORDER — OXYCODONE HCL 5 MG PO TABS: 5.0000 mg | ORAL_TABLET | Freq: Three times a day (TID) | ORAL | 0 refills | Status: DC | PRN

## 2022-12-26 MED ORDER — IBUPROFEN 600 MG PO TABS
600.0000 mg | ORAL_TABLET | Freq: Three times a day (TID) | ORAL | Status: DC
  Administered 2022-12-26: 600 mg via ORAL
  Filled 2022-12-26: qty 1

## 2022-12-26 MED ORDER — ONDANSETRON HCL 4 MG PO TABS: 4.0000 mg | ORAL_TABLET | Freq: Three times a day (TID) | ORAL | 1 refills | Status: DC | PRN

## 2022-12-26 MED ORDER — ACETAMINOPHEN 500 MG PO TABS
500.0000 mg | ORAL_TABLET | Freq: Four times a day (QID) | ORAL | 0 refills | Status: AC | PRN
  Filled 2022-12-26: qty 56, 7d supply, fill #0

## 2022-12-26 NOTE — Progress Notes (Signed)
   12/26/22 1013  TOC Brief Assessment  Insurance and Status Reviewed  Patient has primary care physician No (see note)  Home environment has been reviewed yes  Prior level of function: independent  Prior/Current Home Services No current home services  Social Determinants of Health Reivew SDOH reviewed interventions complete  Readmission risk has been reviewed Yes  Transition of care needs no transition of care needs at this time    Patient does not have PCP. Discussed with patient she can call insurance number on her card and be provided a list of PCP's in network with her insurance and chose from list. Patient voiced understanding

## 2022-12-26 NOTE — Discharge Summary (Signed)
[facility] Discharge Summary   Patient ID: Veronica Moyer MRN: [id number] DOB/AGE: 21-Jul-1995 27 y.o.  Admit date: 12/24/2022 Discharge date: 12/26/2022  Admitting Diagnosis: Neck abscess  Discharge Diagnosis Patient Active Problem List   Diagnosis Date Noted   Neck abscess 12/25/2022   Hidradenitis 04/05/2016   Axillary abscess 04/04/2016    Consultants None   Imaging: CT Soft Tissue Neck W Contrast  Result Date: 12/24/2022 CLINICAL DATA:  Soft tissue infection suspected, neck, xray done. EXAM: CT NECK WITH CONTRAST TECHNIQUE: Multidetector CT imaging of the neck was performed using the standard protocol following the bolus administration of intravenous contrast. RADIATION DOSE REDUCTION: This exam was performed according to the departmental dose-optimization program which includes automated exposure control, adjustment of the mA and/or kV according to patient size and/or use of iterative reconstruction technique. CONTRAST:  75mL OMNIPAQUE IOHEXOL 350 MG/ML SOLN COMPARISON:  None Available. FINDINGS: Pharynx and larynx: Normal. No mass or swelling. Salivary glands: No inflammation, mass, or stone. Thyroid: Normal. Lymph nodes: Mildly enlarged right posterior cervical lymph node, likely reactive. Vascular: Normal. Limited intracranial: Unremarkable. Visualized orbits: Normal. Mastoids and visualized paranasal sinuses: Well aerated. Skeleton: Normal. Upper chest: Unremarkable. Other: 3.3 x 3.1 cm thick-walled fluid collection with surrounding inflammation in the posterior neck soft tissues, extending to involve the right dorsal nuchal musculature. IMPRESSION: 3.3 cm abscess in the posterior neck soft tissues, extending to involve the right dorsal nuchal musculature. Electronically Signed   By: Orvan Falconer M.D.   On: 12/24/2022 18:45    Procedures Dr. Manus Rudd 12/25/22 - Incision and drainage of posterior neck abscess   Hospital Course:  27 y/o F with history of  hidradenitis who presented to [facility] with neck pain.  Workup showed neck abscess.  Patient was admitted and underwent procedure listed above.  Tolerated procedure well and was transferred to the floor.  Diet was advanced as tolerated.  On POD#1, the patient was voiding well, tolerating diet, ambulating well, pain well controlled, vital signs stable, incisions c/d/i and felt stable for discharge home.  Patient will follow up in our office as below and knows to call with questions or concerns. 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   I have personally reviewed the patients medication history on the  controlled substance database.   Physical Exam: General:  Alert, NAD, pleasant, comfortable Neck: there is a surgical incision measuring approximately 3x3x4cm with penrose in good position. Guaze packing removed. There is no cellulitis or fluctuance.  Allergies as of 12/26/2022       Reactions   Wound Dressing Adhesive Rash        Medication List     STOP taking these medications    HYDROcodone-acetaminophen 5-325 MG tablet Commonly known as: NORCO/VICODIN       TAKE these medications    acetaminophen 500 MG tablet Commonly known as: TYLENOL Take 1-2 tablets (500-1,000 mg total) by mouth every 6 (six) hours as needed for up to 7 days.   docusate sodium 100 MG capsule Commonly known as: COLACE Take 1 capsule (100 mg total) by mouth 2 (two) times daily.   ibuprofen 600 MG tablet Commonly known as: ADVIL Take 1 tablet (600 mg total) by mouth every 8 (eight) hours as needed for up to 10 days for mild pain (pain score 1-3) or moderate pain (pain score 4-6).   ondansetron 4 MG tablet Commonly known as: Zofran Take 1 tablet (4 mg total) by  mouth every 8 (eight) hours as needed for nausea or vomiting.   oxyCODONE 5 MG immediate release tablet Commonly known as: Oxy IR/ROXICODONE Take 1 tablet (5 mg total) by mouth every 8 (eight) hours as needed  for severe pain (pain score 7-10) (not releived by advil, tylenol).          Follow-up Information     Manus Rudd, MD. Go on 01/25/2023.   Specialty: General Surgery Why: Your appointment is 1/10 9:40am Arrive early to check in, fill out paperwork, Designer, fashion/clothing ID and insurance information Contact information: [address] [place] [address] [place] [address] [phone]                 Signed: Hosie Spangle, [facility] 12/26/2022, 8:15 AM

## 2022-12-26 NOTE — Progress Notes (Signed)
   12/26/22 1010  SDOH Interventions  Transportation Interventions Inpatient TOC   Food and transportation hard copy resources given to patient. Nurse has already printed the AVS.   Patient does have transportation home today

## 2022-12-28 LAB — AEROBIC/ANAEROBIC CULTURE W GRAM STAIN (SURGICAL/DEEP WOUND)
Culture: NEGATIVE
Gram Stain: NONE SEEN

## 2023-08-25 ENCOUNTER — Emergency Department (HOSPITAL_COMMUNITY)
Admission: EM | Admit: 2023-08-25 | Discharge: 2023-08-25 | Disposition: A | Discharge status: Home / Self Care | Attending: Emergency Medicine | Admitting: Emergency Medicine

## 2023-08-25 ENCOUNTER — Encounter (HOSPITAL_COMMUNITY): Payer: Self-pay | Admitting: *Deleted

## 2023-08-25 ENCOUNTER — Other Ambulatory Visit: Payer: Self-pay

## 2023-08-25 ENCOUNTER — Emergency Department (HOSPITAL_COMMUNITY)

## 2023-08-25 DIAGNOSIS — S8992XA Unspecified injury of left lower leg, initial encounter: Secondary | ICD-10-CM | POA: Diagnosis present

## 2023-08-25 DIAGNOSIS — W138XXA Fall from, out of or through other building or structure, initial encounter: Secondary | ICD-10-CM | POA: Diagnosis not present

## 2023-08-25 DIAGNOSIS — M25462 Effusion, left knee: Secondary | ICD-10-CM | POA: Diagnosis not present

## 2023-08-25 DIAGNOSIS — Y9389 Activity, other specified: Secondary | ICD-10-CM | POA: Insufficient documentation

## 2023-08-25 MED ORDER — ACETAMINOPHEN 325 MG PO TABS
650.0000 mg | ORAL_TABLET | Freq: Once | ORAL | Status: AC
  Administered 2023-08-25: 650 mg via ORAL

## 2023-08-25 MED ORDER — KETOROLAC TROMETHAMINE 15 MG/ML IJ SOLN
15.0000 mg | Freq: Once | INTRAMUSCULAR | Status: AC
  Administered 2023-08-25: 15 mg via INTRAMUSCULAR
  Filled 2023-08-25: qty 1

## 2023-08-25 MED ORDER — ACETAMINOPHEN 325 MG PO TABS
ORAL_TABLET | ORAL | Status: AC
  Filled 2023-08-25: qty 2

## 2023-08-25 NOTE — ED Provider Notes (Signed)
 [place] EMERGENCY DEPARTMENT AT [facility] Provider Note   CSN: [id number] Arrival date & time: 08/25/23  1513     Patient presents with: Knee Injury   Veronica Moyer is a 28 y.o. female.   HPI Pt is a 28 y/o female presenting to the ED for concerns for L knee pain after falling from a 6' fence while trying into her pool today.  Noted to have fallen onto her left knee, did not hit head, did not lose consciousness and is not reporting pain anywhere else.  Has not been able to walk on her left knee secondary to pain since the injury.  Has noted some swelling to her left knee.  Denies numbness, tingling.    Prior to Admission medications   Medication Sig Start Date End Date Taking? Authorizing Provider  docusate sodium  (COLACE) 100 MG capsule Take 1 capsule (100 mg total) by mouth 2 (two) times daily. 12/26/22   Augustus Almarie RAMAN, PA-C  ondansetron  (ZOFRAN ) 4 MG tablet Take 1 tablet (4 mg total) by mouth every 8 (eight) hours as needed for nausea or vomiting. 12/26/22 12/26/23  Augustus Almarie RAMAN, PA-C  oxyCODONE  (OXY IR/ROXICODONE ) 5 MG immediate release tablet Take 1 tablet (5 mg total) by mouth every 8 (eight) hours as needed for severe pain (pain score 7-10) (not relieved by advil , tylenol ). 12/26/22   Augustus Almarie RAMAN, PA-C    Allergies: Wound dressing adhesive    Review of Systems  Musculoskeletal:  Positive for arthralgias.  All other systems reviewed and are negative.   Updated Vital Signs BP (!) 156/97 (BP Location: Right Arm)   Pulse (!) 123   Temp 98.4 F (36.9 C)   Resp 15   Ht 5' 1 (1.549 m)   Wt 122.5 kg   SpO2 99%   BMI 51.03 kg/m   Physical Exam Vitals and nursing note reviewed.  Constitutional:      General: She is not in acute distress.    Appearance: Normal appearance. She is not ill-appearing or diaphoretic.  HENT:     Head: Normocephalic and atraumatic.  Eyes:     General: No scleral icterus.       Right eye: No  discharge.        Left eye: No discharge.     Extraocular Movements: Extraocular movements intact.     Conjunctiva/sclera: Conjunctivae normal.  Cardiovascular:     Rate and Rhythm: Normal rate and regular rhythm.     Pulses: Normal pulses.     Heart sounds: Normal heart sounds. No murmur heard.    No friction rub. No gallop.  Pulmonary:     Effort: Pulmonary effort is normal. No respiratory distress.     Breath sounds: No stridor. No wheezing, rhonchi or rales.  Chest:     Chest wall: No tenderness.  Abdominal:     General: Abdomen is flat. There is no distension.     Palpations: Abdomen is soft.     Tenderness: There is no abdominal tenderness. There is no right CVA tenderness, left CVA tenderness, guarding or rebound.  Musculoskeletal:        General: Swelling present. No deformity or signs of injury.     Cervical back: Normal range of motion. No rigidity.     Right lower leg: No edema.     Left lower leg: Edema present.  Skin:    General: Skin is warm and dry.     Findings: No bruising, erythema or  lesion.  Neurological:     General: No focal deficit present.     Mental Status: She is alert and oriented to person, place, and time. Mental status is at baseline.     Sensory: No sensory deficit.     Motor: No weakness.  Psychiatric:        Mood and Affect: Mood normal.     (all labs ordered are listed, but only abnormal results are displayed) Labs Reviewed - No data to display  EKG: None  Radiology: DG Knee Complete 4 Views Left Result Date: 08/25/2023 CLINICAL DATA:  fell this am climbing over a fence and she fell onto the knee pt c/o pain EXAM: LEFT KNEE - COMPLETE 4+ VIEW COMPARISON:  None Available. FINDINGS: No acute fracture. Widening of the lateral tibiofemoral joint space on a single view. There is a joint effusion. No evidence of focal bone abnormality or erosion. Mild soft tissue edema. IMPRESSION: 1. No acute fracture. 2. Widening of the lateral tibiofemoral  joint space on a single view, can be seen with ligamentous injury. 3. Joint effusion. Electronically Signed   By: Andrea Gasman M.D.   On: 08/25/2023 16:21   Procedures   Medications Ordered in the ED  ketorolac  (TORADOL ) 15 MG/ML injection 15 mg (has no administration in time range)  acetaminophen  (TYLENOL ) tablet 650 mg (650 mg Oral Given 08/25/23 1530)      Medical Decision Making Amount and/or Complexity of Data Reviewed Radiology: ordered.  Risk OTC drugs.   This patient is a 28 year old female who presents to the ED for concern of left knee injury after falling from a fence while trying to get into her local pool has not been able to ambulate but has good sensation, good pulses and good range of motion the ankle, hip.  On physical exam, patient is in no acute distress, afebrile, alert and orient x 4, speaking in full sentences, nontachypneic, nontachycardic.  Notable swelling tenderness over left knee.  Valgus stress test and Lachman test both positive for pain.  Suspicious for possible ACL/MCL injuries.  No other injuries otherwise.  Provided Toradol  per patient's request after confirming no chance of pregnancy.  Patient provided crutches, Ace wrap and knee brace.  For follow-up with orthopedic surgery for further management as well as using Tylenol  and ibuProfen  at home.  XR shows joint effusion with space widening suspicious for ligament injury.   Patient vital signs have remained stable throughout the course of patient's time in the ED. Low suspicion for any other emergent pathology at this time. I believe this patient is safe to be discharged. Provided strict return to ER precautions. Patient expressed agreement and understanding of plan. All questions were answered.  Differential diagnoses prior to evaluation: The emergent differential diagnosis includes, but is not limited to, fracture, ligamentous injury, neurovascular injury, dislocation, malalignment. This is not an  exhaustive differential.   Past Medical History / Co-morbidities / Social History: Bipolar disorder, depression, anxiety, PCOS  Additional history: Chart reviewed.   Lab Tests/Imaging studies: I personally interpreted labs/imaging and the pertinent results include:    X-ray of left knee shows widening of the lateral tibiofemoral joint space likely seen with ligamentous injury as well as joint effusion present.  I agree with the radiologist interpretation.     Medications: I ordered medication including Toradol .  I have reviewed the patients home medicines and have made adjustments as needed.  Critical Interventions: None  Social Determinants of Health: Reports she is not a PCP  at this time.  Disposition: After consideration of the diagnostic results and the patients response to treatment, I feel that the patient would benefit from discharge and treat as above.   emergency department workup does not suggest an emergent condition requiring admission or immediate intervention beyond what has been performed at this time. The plan is: Follow-up with orthopedic surgery, RICE, brace, Tylenol  a ibuProfen  for pain management, return for new or worsening symptoms. The patient is safe for discharge and has been instructed to return immediately for worsening symptoms, change in symptoms or any other concerns.   Final diagnoses:  Effusion of left knee  Injury of left knee, initial encounter    ED Discharge Orders     None          Beola Terrall GORMAN DEVONNA 08/25/23 1723    Patt Alm Macho, MD 08/25/23 2159

## 2023-08-25 NOTE — Discharge Instructions (Addendum)
 You were seen today for L knee injury which both x-ray and physical exam are suspicious for possible ligament injury. You will need to follow up with orthopedic surgery for further management. I provided their info for you to call their office and set up an appointment. Continue to wear brace and use crutches. Rest, elevate, ice and compress the area to help reduce pain with using tyelnol and ibuprofen  for additional pain relief.   Take Ibuprofen  400mg  every 4-6 hours for pain or fever, not exceeding 3,200 mg per day as more than 3,200mg  can cause Stomach irritation, dizziness, kidney issues with long-term use.  Take Tylenol  (acetominophen)  650mg  every 4-6 hours, as needed for pain or fever. Do not take more than 4,000 mg in a 24-hour period. As this may cause liver damage. While this is rare, if you begin to develop yellowing of the skin or eyes, stop taking and return to ER immediately.

## 2023-08-25 NOTE — ED Notes (Signed)
ED PA at BS 

## 2023-08-25 NOTE — ED Triage Notes (Signed)
 The pt fell climbing over a fence at 0900am today  she injured her lt kne when she fell from the fence  she has no taken anything  fro the pain no previous injury  lmp none

## 2023-08-25 NOTE — Progress Notes (Signed)
 Orthopedic Tech Progress Note Patient Details:  Veronica Moyer 02-14-1995 [id number]  Ortho Devices Type of Ortho Device: Crutches, Knee Sleeve Ortho Device/Splint Location: LLE Ortho Device/Splint Interventions: Ordered, Application, Adjustment   Post Interventions Patient Tolerated: Well, Ambulated well Instructions Provided: Care of device  Delanna LITTIE Pac 08/25/2023, 6:16 PM

## 2023-08-25 NOTE — ED Notes (Signed)
 Ortho stopped by; pt. Educated on crutches and knee brace; pt. Wheelchaired out

## 2023-08-25 NOTE — ED Notes (Signed)
 Ortho tech paged, will see, on the way

## 2023-09-12 ENCOUNTER — Emergency Department (HOSPITAL_COMMUNITY)

## 2023-09-12 ENCOUNTER — Emergency Department (HOSPITAL_COMMUNITY)
Admission: EM | Admit: 2023-09-12 | Discharge: 2023-09-12 | Disposition: A | Discharge status: Home / Self Care | Attending: Emergency Medicine | Admitting: Emergency Medicine

## 2023-09-12 ENCOUNTER — Other Ambulatory Visit: Payer: Self-pay

## 2023-09-12 DIAGNOSIS — W57XXXA Bitten or stung by nonvenomous insect and other nonvenomous arthropods, initial encounter: Secondary | ICD-10-CM | POA: Diagnosis not present

## 2023-09-12 DIAGNOSIS — L03311 Cellulitis of abdominal wall: Secondary | ICD-10-CM | POA: Diagnosis not present

## 2023-09-12 DIAGNOSIS — S30861A Insect bite (nonvenomous) of abdominal wall, initial encounter: Secondary | ICD-10-CM | POA: Diagnosis present

## 2023-09-12 DIAGNOSIS — D72829 Elevated white blood cell count, unspecified: Secondary | ICD-10-CM | POA: Insufficient documentation

## 2023-09-12 LAB — CBC WITH DIFFERENTIAL/PLATELET
Abs Immature Granulocytes: 0.05 K/uL (ref 0.00–0.07)
Basophils Absolute: 0.1 K/uL (ref 0.0–0.1)
Basophils Relative: 1 %
Eosinophils Absolute: 0.1 K/uL (ref 0.0–0.5)
Eosinophils Relative: 0 %
HCT: 41.9 % (ref 36.0–46.0)
Hemoglobin: 14.1 g/dL (ref 12.0–15.0)
Immature Granulocytes: 0 %
Lymphocytes Relative: 21 %
Lymphs Abs: 3.1 K/uL (ref 0.7–4.0)
MCH: 31.9 pg (ref 26.0–34.0)
MCHC: 33.7 g/dL (ref 30.0–36.0)
MCV: 94.8 fL (ref 80.0–100.0)
Monocytes Absolute: 0.7 K/uL (ref 0.1–1.0)
Monocytes Relative: 5 %
Neutro Abs: 10.6 K/uL — ABNORMAL HIGH (ref 1.7–7.7)
Neutrophils Relative %: 73 %
Platelets: 416 K/uL — ABNORMAL HIGH (ref 150–400)
RBC: 4.42 MIL/uL (ref 3.87–5.11)
RDW: 12.5 % (ref 11.5–15.5)
WBC: 14.6 K/uL — ABNORMAL HIGH (ref 4.0–10.5)
nRBC: 0 % (ref 0.0–0.2)

## 2023-09-12 LAB — COMPREHENSIVE METABOLIC PANEL WITH GFR
ALT: 35 U/L (ref 0–44)
AST: 43 U/L — ABNORMAL HIGH (ref 15–41)
Albumin: 3.9 g/dL (ref 3.5–5.0)
Alkaline Phosphatase: 135 U/L — ABNORMAL HIGH (ref 38–126)
Anion gap: 13 (ref 5–15)
BUN: 6 mg/dL (ref 6–20)
CO2: 22 mmol/L (ref 22–32)
Calcium: 9.4 mg/dL (ref 8.9–10.3)
Chloride: 106 mmol/L (ref 98–111)
Creatinine, Ser: 0.68 mg/dL (ref 0.44–1.00)
GFR, Estimated: >60 mL/min (ref >60)
Glucose, Bld: 103 mg/dL — ABNORMAL HIGH (ref 70–99)
Potassium: 3.7 mmol/L (ref 3.5–5.1)
Sodium: 141 mmol/L (ref 135–145)
Total Bilirubin: 0.6 mg/dL (ref 0.0–1.2)
Total Protein: 7.7 g/dL (ref 6.5–8.1)

## 2023-09-12 LAB — I-STAT CG4 LACTIC ACID, ED: Lactic Acid, Venous: 1.7 mmol/L (ref 0.5–1.9)

## 2023-09-12 LAB — HCG, SERUM, QUALITATIVE: Preg, Serum: NEGATIVE

## 2023-09-12 MED ORDER — ONDANSETRON HCL 4 MG/2ML IJ SOLN
4.0000 mg | Freq: Once | INTRAMUSCULAR | Status: AC
  Administered 2023-09-12: 4 mg via INTRAVENOUS
  Filled 2023-09-12: qty 2

## 2023-09-12 MED ORDER — PROCHLORPERAZINE EDISYLATE 10 MG/2ML IJ SOLN
10.0000 mg | Freq: Once | INTRAMUSCULAR | Status: AC
  Administered 2023-09-12: 10 mg via INTRAVENOUS
  Filled 2023-09-12: qty 2

## 2023-09-12 MED ORDER — SODIUM CHLORIDE 0.9 % IV SOLN
2.0000 g | Freq: Once | INTRAVENOUS | Status: AC
  Administered 2023-09-12: 2 g via INTRAVENOUS
  Filled 2023-09-12: qty 20

## 2023-09-12 MED ORDER — IOHEXOL 350 MG/ML SOLN
75.0000 mL | Freq: Once | INTRAVENOUS | Status: AC | PRN
  Administered 2023-09-12: 75 mL via INTRAVENOUS

## 2023-09-12 MED ORDER — ONDANSETRON HCL 4 MG PO TABS: 4.0000 mg | ORAL_TABLET | Freq: Four times a day (QID) | ORAL | 0 refills | Status: DC

## 2023-09-12 MED ORDER — HYDROCODONE-ACETAMINOPHEN 5-325 MG PO TABS: 1.0000 | ORAL_TABLET | Freq: Four times a day (QID) | ORAL | 0 refills | Status: DC | PRN

## 2023-09-12 MED ORDER — KETOROLAC TROMETHAMINE 15 MG/ML IJ SOLN
15.0000 mg | Freq: Once | INTRAMUSCULAR | Status: AC
  Administered 2023-09-12: 15 mg via INTRAVENOUS
  Filled 2023-09-12: qty 1

## 2023-09-12 MED ORDER — SODIUM CHLORIDE 0.9 % IV BOLUS
1000.0000 mL | Freq: Once | INTRAVENOUS | Status: AC
  Administered 2023-09-12: 1000 mL via INTRAVENOUS

## 2023-09-12 MED ORDER — ONDANSETRON 4 MG PO TBDP
4.0000 mg | ORAL_TABLET | Freq: Once | ORAL | Status: AC
  Administered 2023-09-12: 4 mg via ORAL
  Filled 2023-09-12: qty 1

## 2023-09-12 MED ORDER — DOXYCYCLINE HYCLATE 100 MG PO TABS
100.0000 mg | ORAL_TABLET | Freq: Once | ORAL | Status: AC
  Administered 2023-09-12: 100 mg via ORAL
  Filled 2023-09-12: qty 1

## 2023-09-12 MED ORDER — FENTANYL CITRATE PF 50 MCG/ML IJ SOSY
50.0000 ug | PREFILLED_SYRINGE | Freq: Once | INTRAMUSCULAR | Status: AC
  Administered 2023-09-12: 50 ug via INTRAVENOUS
  Filled 2023-09-12: qty 1

## 2023-09-12 MED ORDER — DOXYCYCLINE HYCLATE 100 MG PO CAPS: 100.0000 mg | ORAL_CAPSULE | Freq: Two times a day (BID) | ORAL | 0 refills | Status: DC

## 2023-09-12 NOTE — Discharge Instructions (Addendum)
 It was a pleasure taking part in your care.  As discussed, the area on your stomach is cellulitis.  Please begin taking doxycycline  twice a day for 7 days.  Please take Zofran  every 6 hours as needed for nausea.  Please take 1 tablet of hydrocodone  for pain at home if pain not well-controlled with Tylenol  and ibuprofen .  Return to ED with any new symptoms.  Follow-up with your PCP.

## 2023-09-12 NOTE — ED Provider Notes (Signed)
 Accepted handoff at shift change from RL PA-C. Please see prior provider note for more detail.   Briefly: Patient is 28 y.o.      She has been doing p.o. challenge after IV antibiotic infusion patient      Physical Exam  BP 138/71   Pulse 100   Temp 98.9 F (37.2 C)   Resp 18   Ht 5' 1 (1.549 m)   Wt 117.9 kg   LMP  (LMP Unknown)   SpO2 99%   BMI 49.13 kg/m   Physical Exam     Procedures  Procedures  ED Course / MDM    Medical Decision Making Amount and/or Complexity of Data Reviewed Labs: ordered. Radiology: ordered.  Risk Prescription drug management.    Well appearing. Agrees to plan. Will DC home after IV toradol  and PO doxy here.      Neldon Inoue [place], [place] 09/14/23 [id number]    Garrick Charleston, MD 09/17/23 [phone]

## 2023-09-12 NOTE — ED Provider Notes (Signed)
 [place] EMERGENCY DEPARTMENT AT [facility] Provider Note   CSN: [id number] Arrival date & time: 09/12/23  0130     Patient presents with: Insect Bite   Veronica Moyer is a 28 y.o. female with history of PCOS, bipolar disorder, anxiety and abscess or abscess.  Patient presents to ED for evaluation of spider bite to abdomen.  States that on Wednesday morning she woke up and noticed a spider bite that had bitten her abdomen.  States that since Wednesday she has developed nausea, vomit, fevers at home, cramping in her abdomen.  States that this morning began to drain purulent type material.  She reports that the spider that bit her was brown but she cannot provide any more details about this matter.  She denies any chest pain, shortness of breath.  She denies any diarrhea.  Denies any urinary symptoms.  HPI     Prior to Admission medications   Medication Sig Start Date End Date Taking? Authorizing Provider  doxycycline  (VIBRAMYCIN ) 100 MG capsule Take 1 capsule (100 mg total) by mouth 2 (two) times daily. 09/12/23  Yes Ruthell Lonni FALCON, PA-C  HYDROcodone -acetaminophen  (NORCO/VICODIN) 5-325 MG tablet Take 1 tablet by mouth every 6 (six) hours as needed for severe pain (pain score 7-10). 09/12/23  Yes Ruthell Lonni F, PA-C  ondansetron  (ZOFRAN ) 4 MG tablet Take 1 tablet (4 mg total) by mouth every 6 (six) hours. 09/12/23  Yes Ruthell Lonni FALCON, PA-C  docusate sodium  (COLACE) 100 MG capsule Take 1 capsule (100 mg total) by mouth 2 (two) times daily. 12/26/22   Augustus Almarie RAMAN, PA-C  oxyCODONE  (OXY IR/ROXICODONE ) 5 MG immediate release tablet Take 1 tablet (5 mg total) by mouth every 8 (eight) hours as needed for severe pain (pain score 7-10) (not relieved by advil , tylenol ). 12/26/22   Augustus Almarie RAMAN, PA-C    Allergies: Wound dressing adhesive    Review of Systems  Constitutional:  Positive for fever.  Gastrointestinal:  Positive for abdominal pain,  nausea and vomiting.  Skin:  Positive for wound.  All other systems reviewed and are negative.   Updated Vital Signs BP 138/71   Pulse 100   Temp 98.9 F (37.2 C)   Resp 18   Ht 5' 1 (1.549 m)   Wt 117.9 kg   LMP  (LMP Unknown)   SpO2 99%   BMI 49.13 kg/m   Physical Exam Vitals and nursing note reviewed.  Constitutional:      General: She is not in acute distress.    Appearance: She is well-developed.  HENT:     Head: Normocephalic and atraumatic.  Eyes:     Conjunctiva/sclera: Conjunctivae normal.  Cardiovascular:     Rate and Rhythm: Normal rate and regular rhythm.     Heart sounds: No murmur heard. Pulmonary:     Effort: Pulmonary effort is normal. No respiratory distress.     Breath sounds: Normal breath sounds.  Abdominal:     Palpations: Abdomen is soft.     Tenderness: There is abdominal tenderness.     Comments: RLQ with surrounding induration, open wound draining purulent material.  Please see photo. No fluctuance noted.  Musculoskeletal:        General: No swelling.     Cervical back: Neck supple.  Skin:    General: Skin is warm and dry.     Capillary Refill: Capillary refill takes less than 2 seconds.  Neurological:     Mental Status: She is alert.  Psychiatric:        Mood and Affect: Mood normal.     (all labs ordered are listed, but only abnormal results are displayed) Labs Reviewed  COMPREHENSIVE METABOLIC PANEL WITH GFR - Abnormal; Notable for the following components:      Result Value   Glucose, Bld 103 (*)    AST 43 (*)    Alkaline Phosphatase 135 (*)    All other components within normal limits  CBC WITH DIFFERENTIAL/PLATELET - Abnormal; Notable for the following components:   WBC 14.6 (*)    Platelets 416 (*)    Neutro Abs 10.6 (*)    All other components within normal limits  HCG, SERUM, QUALITATIVE  I-STAT CG4 LACTIC ACID, ED  I-STAT CG4 LACTIC ACID, ED    EKG: None  Radiology: CT ABDOMEN PELVIS W CONTRAST Result Date:  09/12/2023 EXAM: CT ABDOMEN AND PELVIS WITH CONTRAST 09/12/2023 05:55:36 AM TECHNIQUE: CT of the abdomen and pelvis was performed with the administration of intravenous contrast. Multiplanar reformatted images are provided for review. Automated exposure control, iterative reconstruction, and/or weight-based adjustment of the mA/kV was utilized to reduce the radiation dose to as low as reasonably achievable. CONTRAST: 75mL (iohexol  (OMNIPAQUE ) 350 MG/ML injection 75 mL IOHEXOL  350 MG/ML SOLN) COMPARISON: None available. CLINICAL HISTORY: Concern for deep abscess to RLQ. Pt states she was bit by a spider a couple of days ago on her stomach. She states that she saw a brown spider but doesn't know what kind it was. She endorses it is red, swollen, and draining. States she has had abd cramping since and sweats. FINDINGS: LOWER CHEST: Visualized lung bases are clear. LIVER: Normal size and contour. GALLBLADDER AND BILE DUCTS: No wall thickening. No cholelithiasis. No biliary ductal dilatation. SPLEEN: Normal size. No focal lesion. PANCREAS: No mass. No ductal dilatation. ADRENAL GLANDS: Normal appearance. No mass. KIDNEYS, URETERS AND BLADDER: No stones in the kidneys or ureters. No hydronephrosis. No perinephric or periureteral stranding. Urinary bladder is unremarkable. GI AND BOWEL: Stomach demonstrates no acute abnormality. There is no bowel obstruction. No bowel wall thickening. PERITONEUM AND RETROPERITONEUM: No ascites. No free air. VASCULATURE: Aorta is normal in caliber. LYMPH NODES: No lymphadenopathy. REPRODUCTIVE ORGANS: No significant abnormality. BONES AND SOFT TISSUES: Within the right lower quadrant ventral abdominal wall there is asymmetric skin thickening with subcutaneous soft tissue stranding. Focal area of superficial cutaneous nodular thickening measures 3.5 x 1.6 cm on image 63. No loculated fluid collection identified to suggest the presence of a drainable abscess. IMPRESSION: 1. Signs of  cellulitis involving the right lower quadrant ventral abdominal wall with a focal area of superficial nodular soft tissue thickening of the skin measuring 3.5x1.3 cm compatible with suspected spider bite. No abscess identified. Electronically signed by: Waddell Calk MD 09/12/2023 06:12 AM EDT RP Workstation: [id number]    Procedures   Medications Ordered in the ED  ondansetron  (ZOFRAN -ODT) disintegrating tablet 4 mg (4 mg Oral Given 09/12/23 0145)  cefTRIAXone  (ROCEPHIN ) 2 g in sodium chloride  0.9 % 100 mL IVPB (0 g Intravenous Stopped 09/12/23 0608)  sodium chloride  0.9 % bolus 1,000 mL (1,000 mLs Intravenous New Bag/Given 09/12/23 0523)  fentaNYL  (SUBLIMAZE ) injection 50 mcg (50 mcg Intravenous Given 09/12/23 0522)  ondansetron  (ZOFRAN ) injection 4 mg (4 mg Intravenous Given 09/12/23 0533)  iohexol  (OMNIPAQUE ) 350 MG/ML injection 75 mL (75 mLs Intravenous Contrast Given 09/12/23 0552)    Medical Decision Making Amount and/or Complexity of Data Reviewed Labs: ordered. Radiology: ordered.  Risk Prescription drug  management.   This is a 28 year old female presenting to the ED out of concern of a bug bite to her right lower quadrant of her abdomen.  On exam, the patient is afebrile, slightly tachycardic to 104.  Lung sounds are clear bilaterally, no hypoxia.  Abdomen has tenderness to the right lower quadrant with an area what appears to be cellulitis with surrounding induration but no fluctuance, slight drainage of purulent material.  Neuroexam at baseline.  Lab initiated in triage include CBC, CMP, lactic acid, hCG serum.  I have added on CT abdomen pelvis.  Patient given 2 g Rocephin , 4 of Zofran  and 1 L of fluid.  Patient CBC with leukocytosis 14.6, no anemia.  Metabolic panel with elevated alk phos 135 however no other elevated LFTs, no electrolyte derangement, anion gap 13.  Lactic acid 1.7.  hCG negative.  CT abdomen pelvis shows area of cellulitis but no drainable fluid  collection.  At this time, patient will need to pass PO fluid challenge. If able to pass, patient will be discharged home with doxycycline , zofran , pain control. At end of shift, patient PO fluid challenge has not been completed. Signed out to oncoming provider [place] PA-C. Plan of management discussed.   Final diagnoses:  Cellulitis of abdominal wall  Insect bite of abdominal wall, initial encounter    ED Discharge Orders          Ordered    HYDROcodone -acetaminophen  (NORCO/VICODIN) 5-325 MG tablet  Every 6 hours PRN        09/12/23 0627    ondansetron  (ZOFRAN ) 4 MG tablet  Every 6 hours        09/12/23 0627    doxycycline  (VIBRAMYCIN ) 100 MG capsule  2 times daily        09/12/23 0627               Ruthell Lonni FALCON, PA-C 09/12/23 9351    Lorette Mayo, MD 09/12/23 [phone]

## 2023-09-12 NOTE — ED Notes (Signed)
Pt tolerated PO fluids and meds

## 2023-09-12 NOTE — ED Triage Notes (Signed)
 Pt was bitten by spider last week. Area on abd in red, warm and spreading. Pt has had fever and chils. Today pt began vomiting

## 2023-09-12 NOTE — ED Notes (Signed)
 Phleb stuck twice for istat, wasn't able to get blood

## 2023-09-12 NOTE — ED Triage Notes (Signed)
 Pt states she was bit by a spider a couple of days ago on her stomach. She states that she saw a brown spider but doesn't know what kind it was. She endorses it is red, swollen, and draining. States she has had abd cramping since and sweats.

## 2023-12-27 ENCOUNTER — Emergency Department (HOSPITAL_BASED_OUTPATIENT_CLINIC_OR_DEPARTMENT_OTHER)

## 2023-12-27 ENCOUNTER — Ambulatory Visit
Admission: RE | Admit: 2023-12-27 | Discharge: 2023-12-27 | Disposition: A | Discharge status: Home / Self Care | Source: Ambulatory Visit | Attending: Family Medicine | Admitting: Family Medicine

## 2023-12-27 ENCOUNTER — Encounter (HOSPITAL_BASED_OUTPATIENT_CLINIC_OR_DEPARTMENT_OTHER): Payer: Self-pay | Admitting: *Deleted

## 2023-12-27 ENCOUNTER — Other Ambulatory Visit: Payer: Self-pay

## 2023-12-27 ENCOUNTER — Emergency Department (HOSPITAL_BASED_OUTPATIENT_CLINIC_OR_DEPARTMENT_OTHER)
Admission: EM | Admit: 2023-12-27 | Discharge: 2023-12-27 | Disposition: A | Discharge status: Home / Self Care | Attending: Emergency Medicine | Admitting: Emergency Medicine

## 2023-12-27 VITALS — BP 146/97 | HR 100 | Temp 98.2°F | Resp 24 | Ht 61.0 in

## 2023-12-27 DIAGNOSIS — N939 Abnormal uterine and vaginal bleeding, unspecified: Secondary | ICD-10-CM | POA: Diagnosis present

## 2023-12-27 DIAGNOSIS — R1031 Right lower quadrant pain: Secondary | ICD-10-CM | POA: Diagnosis not present

## 2023-12-27 DIAGNOSIS — R1032 Left lower quadrant pain: Secondary | ICD-10-CM | POA: Diagnosis not present

## 2023-12-27 DIAGNOSIS — Z3200 Encounter for pregnancy test, result unknown: Secondary | ICD-10-CM

## 2023-12-27 LAB — CBC WITH DIFFERENTIAL/PLATELET
Abs Immature Granulocytes: 0.03 K/uL (ref 0.00–0.07)
Basophils Absolute: 0 K/uL (ref 0.0–0.1)
Basophils Relative: 0 %
Eosinophils Absolute: 0.1 K/uL (ref 0.0–0.5)
Eosinophils Relative: 1 %
HCT: 39.9 % (ref 36.0–46.0)
Hemoglobin: 13.5 g/dL (ref 12.0–15.0)
Immature Granulocytes: 0 %
Lymphocytes Relative: 22 %
Lymphs Abs: 2.3 K/uL (ref 0.7–4.0)
MCH: 32.5 pg (ref 26.0–34.0)
MCHC: 33.8 g/dL (ref 30.0–36.0)
MCV: 95.9 fL (ref 80.0–100.0)
Monocytes Absolute: 0.2 K/uL (ref 0.1–1.0)
Monocytes Relative: 2 %
Neutro Abs: 7.6 K/uL (ref 1.7–7.7)
Neutrophils Relative %: 75 %
Platelets: 356 K/uL (ref 150–400)
RBC: 4.16 MIL/uL (ref 3.87–5.11)
RDW: 12.3 % (ref 11.5–15.5)
WBC: 10.2 K/uL (ref 4.0–10.5)
nRBC: 0 % (ref 0.0–0.2)

## 2023-12-27 LAB — BASIC METABOLIC PANEL WITH GFR
Anion gap: 11 (ref 5–15)
BUN: 6 mg/dL (ref 6–20)
CO2: 25 mmol/L (ref 22–32)
Calcium: 9.1 mg/dL (ref 8.9–10.3)
Chloride: 103 mmol/L (ref 98–111)
Creatinine, Ser: 0.56 mg/dL (ref 0.44–1.00)
GFR, Estimated: >60 mL/min (ref >60)
Glucose, Bld: 84 mg/dL (ref 70–99)
Potassium: 3.7 mmol/L (ref 3.5–5.1)
Sodium: 138 mmol/L (ref 135–145)

## 2023-12-27 LAB — URINALYSIS, ROUTINE W REFLEX MICROSCOPIC
Bilirubin Urine: NEGATIVE
Glucose, UA: NEGATIVE mg/dL
Ketones, ur: NEGATIVE mg/dL
Leukocytes,Ua: NEGATIVE
Nitrite: NEGATIVE
Protein, ur: NEGATIVE mg/dL
Specific Gravity, Urine: >=1.03 (ref 1.005–1.030)
pH: 5.5 (ref 5.0–8.0)

## 2023-12-27 LAB — URINALYSIS, MICROSCOPIC (REFLEX): WBC, UA: NONE SEEN WBC/hpf (ref 0–5)

## 2023-12-27 LAB — POCT URINE PREGNANCY: Preg Test, Ur: NEGATIVE

## 2023-12-27 MED ORDER — ONDANSETRON 4 MG PO TBDP
4.0000 mg | ORAL_TABLET | Freq: Once | ORAL | Status: AC
  Administered 2023-12-27: 4 mg via ORAL
  Filled 2023-12-27: qty 1

## 2023-12-27 MED ORDER — OXYCODONE-ACETAMINOPHEN 5-325 MG PO TABS: 1.0000 | ORAL_TABLET | Freq: Four times a day (QID) | ORAL | 0 refills | Status: DC | PRN

## 2023-12-27 MED ORDER — IOHEXOL 300 MG/ML  SOLN
100.0000 mL | Freq: Once | INTRAMUSCULAR | Status: AC | PRN
  Administered 2023-12-27: 100 mL via INTRAVENOUS

## 2023-12-27 MED ORDER — HYDROMORPHONE HCL 1 MG/ML IJ SOLN
1.0000 mg | Freq: Once | INTRAMUSCULAR | Status: AC
  Administered 2023-12-27: 1 mg via INTRAVENOUS
  Filled 2023-12-27: qty 1

## 2023-12-27 MED ORDER — KETOROLAC TROMETHAMINE 30 MG/ML IJ SOLN
30.0000 mg | Freq: Once | INTRAMUSCULAR | Status: AC
  Administered 2023-12-27: 30 mg via INTRAVENOUS
  Filled 2023-12-27: qty 1

## 2023-12-27 MED ORDER — OXYCODONE-ACETAMINOPHEN 5-325 MG PO TABS
1.0000 | ORAL_TABLET | ORAL | Status: DC | PRN
  Administered 2023-12-27: 1 via ORAL
  Filled 2023-12-27: qty 1

## 2023-12-27 MED ORDER — MEGESTROL ACETATE 20 MG PO TABS: 20.0000 mg | ORAL_TABLET | Freq: Two times a day (BID) | ORAL | 0 refills | Status: DC

## 2023-12-27 NOTE — ED Provider Notes (Signed)
 [place] EMERGENCY DEPARTMENT AT [facility] Provider Note   CSN: [id number] Arrival date & time: 12/27/23  1302     Patient presents with: Vaginal Bleeding   Veronica Moyer is a 28 y.o. female who presents to the ED today with increased vaginal bleeding over the last 3 days.  She states that she has had pelvic pain over the last week however starting last night she began to have abnormal vaginal bleeding that has increased in frequency.  States that today she has gone through 4 tampons.  Is unsure of the last regular menstrual cycle she has had as she has a history of PCOS and has not had a regular menstrual cycle in a significant amount of time.  Does not take any hormonal contraceptives, and at this time does not take any medications.  She describes her pain as lower pelvic with increased pain on the left compared to right.  She occasionally has some lightheadedness and dizziness, exacerbated by changing positions.  Notable previous medical history of hidradenitis suppurativa, PCOS.  Also previously noted bipolar disorder and GAD.  Again, patient denies taking medications as she does not have primary care following at present.  {Add pertinent medical, surgical, social history, OB history to HPI:32947}  Vaginal Bleeding Associated symptoms: dizziness and fatigue        Prior to Admission medications  Not on File    Allergies: Wound dressing adhesive    Review of Systems  Constitutional:  Positive for fatigue.  Genitourinary:  Positive for pelvic pain and vaginal bleeding.  Neurological:  Positive for dizziness.  All other systems reviewed and are negative.   Updated Vital Signs BP (!) 128/93   Pulse (!) 102   Temp 98.2 F (36.8 C) (Oral)   Resp 18   LMP 12/25/2023 (Exact Date)   SpO2 100%   Physical Exam Vitals and nursing note reviewed.  Constitutional:      General: She is awake. She is not in acute distress.    Appearance: Normal appearance.  She is well-developed, well-groomed and overweight.  HENT:     Head: Normocephalic and atraumatic.     Mouth/Throat:     Mouth: Mucous membranes are moist.     Pharynx: Oropharynx is clear.  Eyes:     General: Lids are normal. Vision grossly intact. Gaze aligned appropriately.     Extraocular Movements: Extraocular movements intact.     Conjunctiva/sclera: Conjunctivae normal.     Pupils: Pupils are equal, round, and reactive to light.  Cardiovascular:     Rate and Rhythm: Normal rate and regular rhythm.     Pulses: Normal pulses.     Heart sounds: Normal heart sounds, S1 normal and S2 normal. No murmur heard.    No friction rub. No gallop.  Pulmonary:     Effort: Pulmonary effort is normal.     Breath sounds: Normal breath sounds and air entry.  Abdominal:     General: Abdomen is flat. Bowel sounds are normal.     Palpations: Abdomen is soft.     Tenderness: There is abdominal tenderness in the right lower quadrant, suprapubic area and left lower quadrant.     Comments: There is notable pelvic tenderness with pain with palpation greater left than right.  Musculoskeletal:        General: Normal range of motion.     Cervical back: Normal range of motion and neck supple.     Right lower leg: No edema.  Left lower leg: No edema.  Skin:    General: Skin is warm and dry.     Capillary Refill: Capillary refill takes less than 2 seconds.  Neurological:     General: No focal deficit present.     Mental Status: She is alert and oriented to person, place, and time. Mental status is at baseline.     GCS: GCS eye subscore is 4. GCS verbal subscore is 5. GCS motor subscore is 6.     Cranial Nerves: No cranial nerve deficit.     Sensory: No sensory deficit.  Psychiatric:        Mood and Affect: Mood normal.        Behavior: Behavior is cooperative.     (all labs ordered are listed, but only abnormal results are displayed) Labs Reviewed  URINALYSIS, ROUTINE W REFLEX MICROSCOPIC -  Abnormal; Notable for the following components:      Result Value   Hgb urine dipstick LARGE (*)    All other components within normal limits  URINALYSIS, MICROSCOPIC (REFLEX) - Abnormal; Notable for the following components:   Bacteria, UA RARE (*)    All other components within normal limits  CBC WITH DIFFERENTIAL/PLATELET    EKG: None  Radiology: No results found.  {Document cardiac monitor, telemetry assessment procedure when appropriate:32947} Procedures   Medications Ordered in the ED  oxyCODONE -acetaminophen  (PERCOCET/ROXICET) 5-325 MG per tablet 1 tablet (1 tablet Oral Given 12/27/23 1326)  HYDROmorphone  (DILAUDID ) injection 1 mg (has no administration in time range)  ondansetron  (ZOFRAN -ODT) disintegrating tablet 4 mg (4 mg Oral Given 12/27/23 1326)      {Click here for ABCD2, HEART and other calculators REFRESH Note before signing:1}                              Medical Decision Making Amount and/or Complexity of Data Reviewed Labs: ordered. Radiology: ordered.  Risk Prescription drug management.   Medical Decision Making:   Veronica Moyer is a 28 y.o. female who presented to the ED today with pelvic pain and abnormal vaginal bleeding detailed above.    External chart has been reviewed including previous labs, imaging. Patient placed on continuous vitals and telemetry monitoring while in ED which was reviewed periodically.  Complete initial physical exam performed, notably the patient  was alert and oriented and in no apparent distress.  She did have notable tenderness to the pelvic region with tenderness greater on left than right however tenderness present across the lower abdomen, no rebound or guarding noted..    Reviewed and confirmed nursing documentation for past medical history, family history, social history.    Initial Assessment:   With the patient's presentation of pelvic pain and abnormal vaginal bleeding, differential diagnosis includes  endometriosis, leiomyoma, uterine malignancy; further considering pelvic pain rule out possible ovarian torsion, ovarian cyst.  Consider possible ectopic pregnancy.  Initial Plan:  ***  Screening labs including CBC and Metabolic panel to evaluate for infectious or metabolic etiology of disease.  Urinalysis with reflex culture ordered to evaluate for UTI or relevant urologic/nephrologic pathology.  Obtain pelvic ultrasound to assess for ectopic, other gynecologic etiology. Provide 1 mg Dilaudid  for pain control. Patient was provided with 1 tablet of 5/325 mg oxycodone /acetaminophen  at triage, has not had any improvement with this medication.  Had also received ondansetron  secondary to nausea control. Objective evaluation as below reviewed   Initial Study Results:   Laboratory  All  laboratory results reviewed without evidence of clinically relevant pathology.   Exceptions include: Hematuria noted however suspect this to be false reading secondary to abnormal vaginal bleeding.  Radiology:  All images reviewed independently. ***Agree with radiology report at this time.   No results found.    Consults: Case discussed with ***.   Reassessment and Plan:   ***     {Document critical care time when appropriate  Document review of labs and clinical decision tools ie CHADS2VASC2, etc  Document your independent review of radiology images and any outside records  Document your discussion with family members, caretakers and with consultants  Document social determinants of health affecting pt's care  Document your decision making why or why not admission, treatments were needed:32947:::1}   Final diagnoses:  None    ED Discharge Orders     None

## 2023-12-27 NOTE — ED Triage Notes (Addendum)
 Pt was seen at [facility] today and advised to come here for further workup due to 3 days of pelvic pain and heavy vaginal bleeding.  Pt appears in discomfort during triage.  Pt had a negative pregnancy test at [facility].

## 2023-12-27 NOTE — ED Triage Notes (Signed)
 Pt presents with complaints of lower pelvic/abdominal cramping. This is day three of symptoms. This is accompanied with vaginal bleeding. States she has soaked through two tampons already today. Having big clots come out. Hunched over in triage. Currently rates overall pain a 9/10. Took Tylenol  last night with no noticeable improvement/relief.

## 2023-12-27 NOTE — ED Notes (Signed)
 Pt c/o pain. States the medication and hot packs did not help the pain earlier. RN informed.

## 2023-12-27 NOTE — ED Notes (Signed)
 Patient is being discharged from the Urgent Care and sent to the Emergency Department via private vehicle . Per [name] NP, patient is in need of higher level of care due to abdominal/pelvic cramping + vaginal bleeding. Patient is aware and verbalizes understanding of plan of care.   Vitals:   12/27/23 1152  BP: (!) 146/97  Pulse: 100  Resp: (!) 24  Temp: 98.2 F (36.8 C)  SpO2: 97%

## 2023-12-27 NOTE — ED Provider Notes (Signed)
 [facility]    CSN: [id number] Arrival date & time: 12/27/23  1126      History   Chief Complaint Chief Complaint  Patient presents with   Abdominal Cramping   Vaginal Bleeding    HPI Veronica Moyer is a 28 y.o. female.   HPI pleasant 28 year old female presents with having.  But does not feel right and is concerned with possible pregnancy.  PMH significant for bipolar disorder and PCOS.  Past Medical History:  Diagnosis Date   Anxiety    Axillary abscess 05/2016   right - currently draining, per pt.   Bipolar disorder (HCC)    Depression    PCOS (polycystic ovarian syndrome)     Patient Active Problem List   Diagnosis Date Noted   Neck abscess 12/25/2022   Hidradenitis 04/05/2016   Axillary abscess 04/04/2016    Past Surgical History:  Procedure Laterality Date   INCISION AND DRAINAGE ABSCESS Right 05/17/2016   Procedure: INCISION AND DRAINAGE AXILLARY ABSCESS;  Surgeon: Aron Shoulders, MD;  Location: [facility];  Service: General;  Laterality: Right;   IRRIGATION AND DEBRIDEMENT ABSCESS N/A 04/05/2016   Procedure: IRRIGATION AND DEBRIDEMENT RIGHT AXILLARY & LEFT THIGH ABSCESSES;  Surgeon: Herlene Beverley Bureau, MD;  Location: [facility] OR;  Service: General;  Laterality: N/A;   IRRIGATION AND DEBRIDEMENT ABSCESS N/A 12/25/2022   Procedure: IRRIGATION AND DEBRIDEMENT  OF POSTERIOR NECK ABSCESS;  Surgeon: Belinda Cough, MD;  Location: [facility] OR;  Service: General;  Laterality: N/A;    OB History     Gravida  1   Para      Term      Preterm      AB  1   Living         SAB      IAB      Ectopic      Multiple      Live Births               Home Medications    Prior to Admission medications  Not on File    Family History History reviewed. No pertinent family history.  Social History Social History[1]   Allergies   Wound dressing adhesive   Review of Systems Review of Systems   Physical Exam Triage  Vital Signs ED Triage Vitals  Encounter Vitals Group     BP      Girls Systolic BP Percentile      Girls Diastolic BP Percentile      Boys Systolic BP Percentile      Boys Diastolic BP Percentile      Pulse      Resp      Temp      Temp src      SpO2      Weight      Height      Head Circumference      Peak Flow      Pain Score      Pain Loc      Pain Education      Exclude from Growth Chart    No data found.  Updated Vital Signs BP (!) 146/97 (BP Location: Right Arm)   Pulse 100   Temp 98.2 F (36.8 C) (Oral)   Resp (!) 24   Ht 5' 1 (1.549 m)   LMP 12/25/2023 (Exact Date)   SpO2 97%   BMI 49.13 kg/m   Physical Exam Vitals and nursing note reviewed.  Constitutional:      Appearance: Normal appearance. She is obese.  HENT:     Head: Normocephalic and atraumatic.     Mouth/Throat:     Mouth: Mucous membranes are moist.     Pharynx: Oropharynx is clear.  Eyes:     Extraocular Movements: Extraocular movements intact.     Conjunctiva/sclera: Conjunctivae normal.     Pupils: Pupils are equal, round, and reactive to light.  Cardiovascular:     Rate and Rhythm: Normal rate and regular rhythm.     Heart sounds: Normal heart sounds.  Pulmonary:     Effort: Pulmonary effort is normal.     Breath sounds: Normal breath sounds. No wheezing, rhonchi or rales.  Skin:    General: Skin is warm and dry.  Neurological:     General: No focal deficit present.     Mental Status: She is alert and oriented to person, place, and time. Mental status is at baseline.  Psychiatric:        Mood and Affect: Mood normal.        Behavior: Behavior normal.      UC Treatments / Results  Labs (all labs ordered are listed, but only abnormal results are displayed) Labs Reviewed  POCT URINE PREGNANCY    EKG   Radiology No results found.  Procedures Procedures (including critical care time)  Medications Ordered in UC Medications - No data to display  Initial Impression /  Assessment and Plan / UC Course  I have reviewed the triage vital signs and the nursing notes.  Pertinent labs & imaging results that were available during my care of the patient were reviewed by me and considered in my medical decision making (see chart for details).     MDM:1.  Vaginal bleeding-advised patient to go to [facility] ED now for further evaluation.  Patient agreed and verbalized understanding of these instructions and this plan of care today.  2.  Possible pregnancy-advised patient UA pregnancy was negative today. Advised patient to go to [facility] ED now for further evaluation of vaginal bleeding.  Patient advised UA pregnancy was negative today.  Patient discharged home, hemodynamically stable. Final Clinical Impressions(s) / UC Diagnoses   Final diagnoses:  Possible pregnancy     Discharge Instructions      Advised patient to go to [facility] ED now for further evaluation of vaginal bleeding.  Patient advised UA pregnancy was negative today.     ED Prescriptions   None    PDMP not reviewed this encounter.    [1]  Social History Tobacco Use   Smoking status: Former    Types: Cigarettes    Start date: 2018    Quit date: 2011    Years since quitting: 14.9   Smokeless tobacco: Never   Tobacco comments:    1 pack/week  Vaping Use   Vaping status: Every Day   Substances: Nicotine  Substance Use Topics   Alcohol use: Yes    Comment: 2 x/month   Drug use: No     Teddy Sharper, FNP 12/27/23 1221

## 2023-12-27 NOTE — Discharge Instructions (Addendum)
 Advised patient to go to [facility] ED now for further evaluation of vaginal bleeding.  Patient advised UA pregnancy was negative today.

## 2023-12-27 NOTE — ED Notes (Signed)
 Pt is hunched over in pain.... medicated and hot pack given for comfort

## 2024-01-01 ENCOUNTER — Inpatient Hospital Stay (HOSPITAL_COMMUNITY)

## 2024-01-01 ENCOUNTER — Ambulatory Visit: Payer: Self-pay

## 2024-01-01 ENCOUNTER — Other Ambulatory Visit: Payer: Self-pay

## 2024-01-01 ENCOUNTER — Inpatient Hospital Stay (HOSPITAL_COMMUNITY)
Admission: EM | Admit: 2024-01-01 | Discharge: 2024-01-01 | Disposition: A | Discharge status: Home / Self Care | Source: Home / Self Care | Admitting: Obstetrics and Gynecology

## 2024-01-01 DIAGNOSIS — R109 Unspecified abdominal pain: Secondary | ICD-10-CM | POA: Diagnosis not present

## 2024-01-01 DIAGNOSIS — Z3202 Encounter for pregnancy test, result negative: Secondary | ICD-10-CM | POA: Diagnosis present

## 2024-01-01 DIAGNOSIS — N939 Abnormal uterine and vaginal bleeding, unspecified: Secondary | ICD-10-CM | POA: Diagnosis not present

## 2024-01-01 DIAGNOSIS — Z789 Other specified health status: Secondary | ICD-10-CM | POA: Diagnosis not present

## 2024-01-01 DIAGNOSIS — Z6841 Body Mass Index (BMI) 40.0 and over, adult: Secondary | ICD-10-CM

## 2024-01-01 DIAGNOSIS — E282 Polycystic ovarian syndrome: Secondary | ICD-10-CM | POA: Diagnosis not present

## 2024-01-01 LAB — COMPREHENSIVE METABOLIC PANEL WITH GFR
ALT: 20 U/L (ref 0–44)
AST: 22 U/L (ref 15–41)
Albumin: 4.2 g/dL (ref 3.5–5.0)
Alkaline Phosphatase: 109 U/L (ref 38–126)
Anion gap: 11 (ref 5–15)
BUN: 6 mg/dL (ref 6–20)
CO2: 24 mmol/L (ref 22–32)
Calcium: 9.5 mg/dL (ref 8.9–10.3)
Chloride: 103 mmol/L (ref 98–111)
Creatinine, Ser: 0.64 mg/dL (ref 0.44–1.00)
GFR, Estimated: >60 mL/min (ref >60)
Glucose, Bld: 141 mg/dL — ABNORMAL HIGH (ref 70–99)
Potassium: 3.8 mmol/L (ref 3.5–5.1)
Sodium: 137 mmol/L (ref 135–145)
Total Bilirubin: 0.3 mg/dL (ref 0.0–1.2)
Total Protein: 7.4 g/dL (ref 6.5–8.1)

## 2024-01-01 LAB — URINALYSIS, ROUTINE W REFLEX MICROSCOPIC
Bilirubin Urine: NEGATIVE
Glucose, UA: NEGATIVE mg/dL
Ketones, ur: NEGATIVE mg/dL
Leukocytes,Ua: NEGATIVE
Nitrite: NEGATIVE
Protein, ur: 30 mg/dL — AB
RBC / HPF: >50 RBC/hpf (ref 0–5)
Specific Gravity, Urine: 1.021 (ref 1.005–1.030)
pH: 6 (ref 5.0–8.0)

## 2024-01-01 LAB — CBC WITH DIFFERENTIAL/PLATELET
Abs Immature Granulocytes: 0.04 K/uL (ref 0.00–0.07)
Basophils Absolute: 0.1 K/uL (ref 0.0–0.1)
Basophils Relative: 1 %
Eosinophils Absolute: 0.1 K/uL (ref 0.0–0.5)
Eosinophils Relative: 1 %
HCT: 36 % (ref 36.0–46.0)
Hemoglobin: 12.4 g/dL (ref 12.0–15.0)
Immature Granulocytes: 0 %
Lymphocytes Relative: 22 %
Lymphs Abs: 2.3 K/uL (ref 0.7–4.0)
MCH: 33.1 pg (ref 26.0–34.0)
MCHC: 34.4 g/dL (ref 30.0–36.0)
MCV: 96 fL (ref 80.0–100.0)
Monocytes Absolute: 0.3 K/uL (ref 0.1–1.0)
Monocytes Relative: 3 %
Neutro Abs: 7.7 K/uL (ref 1.7–7.7)
Neutrophils Relative %: 73 %
Platelets: 453 K/uL — ABNORMAL HIGH (ref 150–400)
RBC: 3.75 MIL/uL — ABNORMAL LOW (ref 3.87–5.11)
RDW: 12.1 % (ref 11.5–15.5)
WBC: 10.5 K/uL (ref 4.0–10.5)
nRBC: 0 % (ref 0.0–0.2)

## 2024-01-01 LAB — WET PREP, GENITAL
Clue Cells Wet Prep HPF POC: NONE SEEN
Sperm: NONE SEEN
Trich, Wet Prep: NONE SEEN
WBC, Wet Prep HPF POC: <10 (ref <10)
Yeast Wet Prep HPF POC: NONE SEEN

## 2024-01-01 LAB — LIPASE, BLOOD: Lipase: 23 U/L (ref 11–51)

## 2024-01-01 LAB — POCT PREGNANCY, URINE: Preg Test, Ur: NEGATIVE

## 2024-01-01 MED ORDER — OXYCODONE HCL 5 MG PO TABS
10.0000 mg | ORAL_TABLET | Freq: Once | ORAL | Status: AC
  Administered 2024-01-01: 16:00:00 10 mg via ORAL
  Filled 2024-01-01: qty 2

## 2024-01-01 MED ORDER — KETOROLAC TROMETHAMINE 60 MG/2ML IM SOLN
60.0000 mg | Freq: Once | INTRAMUSCULAR | Status: AC
  Administered 2024-01-01: 18:00:00 60 mg via INTRAMUSCULAR
  Filled 2024-01-01: qty 2

## 2024-01-01 MED ORDER — ONDANSETRON HCL 4 MG PO TABS
8.0000 mg | ORAL_TABLET | Freq: Once | ORAL | Status: AC
  Administered 2024-01-01: 16:00:00 8 mg via ORAL
  Filled 2024-01-01: qty 2

## 2024-01-01 MED ORDER — OXYCODONE HCL 5 MG PO TABS: 10.0000 mg | ORAL_TABLET | Freq: Four times a day (QID) | ORAL | 0 refills | Status: AC | PRN

## 2024-01-01 MED ORDER — KETOROLAC TROMETHAMINE 10 MG PO TABS: 10.0000 mg | ORAL_TABLET | Freq: Four times a day (QID) | ORAL | 0 refills | Status: AC | PRN

## 2024-01-01 NOTE — Telephone Encounter (Signed)
 FYI Only or Action Required?: FYI only for provider: currently in the ED.  Called Nurse Triage reporting Advice Only.  Triage Disposition: Information or Advice Only Call  Patient/caregiver understands and will follow disposition?: Yes  Copied from CRM #[id number]. Topic: Clinical - Red Word Triage >> Jan 01, 2024  4:37 PM [name] wrote: Red Word that prompted transfer to Nurse Triage: Abdominal pain, currently in ED Reason for Disposition  [1] Other NON-URGENT information for PCP AND [2] does not require PCP response  Answer Assessment - Initial Assessment Questions 1. REASON FOR CALL or QUESTION: What is your reason for calling today? or How can I best     Patient is currently in the Emergency Department awaiting evaluation. Reassured patient that she is currently in the best place to be evaluated. Patient verbalized understanding.  2. CALLER: Document the source of call. (e.g., laboratory staff, caregiver or patient).     Patient.  Protocols used: PCP Call - No Triage-A-AH

## 2024-01-01 NOTE — ED Triage Notes (Signed)
 Pt. Stated, Jesus had vaginal bleeding bad and abdominal cramping for a week.

## 2024-01-01 NOTE — ED Triage Notes (Signed)
 Patient having heavy vaginal bleeding since last Wednesday. She was seen at [facility] and was supposed to have a follow up appointment but never received a call about the appointment. She is still having a lot of pain and a lot of bleeding. She is dizzy, denies shob.

## 2024-01-01 NOTE — ED Provider Triage Note (Signed)
 Emergency Medicine Provider Triage Evaluation Note  Veronica Moyer , a 28 y.o. female  was evaluated in triage.  Pt complains of persistent vaginal bleeding over the past several days.  Recently evaluated on 12/12 for similar complaint.  Had reassuring workup..  Review of Systems  Positive:  Negative:   Physical Exam  BP (!) 138/92 (BP Location: Right Arm)   Pulse (!) 104   Temp 99 F (37.2 C)   Resp 16   LMP 12/25/2023 (Exact Date)   SpO2 93%  Gen:   Awake, no distress   Resp:  Normal effort  MSK:   Moves extremities without difficulty  Other:  Mild left lower quadrant abdominal tenderness  Medical Decision Making  Medically screening exam initiated at 1:13 PM.  Appropriate orders placed.  Veronica Moyer was informed that the remainder of the evaluation will be completed by another provider, this initial triage assessment does not replace that evaluation, and the importance of remaining in the ED until their evaluation is complete.  Discussed patient with MAU, will send over for further evaluation and repeat labs at this time.   Veronica Lynwood DEL, PA-C 01/01/24 1313

## 2024-01-01 NOTE — MAU Note (Signed)
 Veronica Moyer is a 28 y.o. at Unknown here in MAU reporting: she's having VB and cramping since last Wednesday.  [facility] last Friday.  Reports was given meds for VB and pain and neither has helped.  States she wearing a diaper and changing it three times per day.    LMP: Irregular - has Hx PCOS Onset of complaint: Wednesday Pain score: 8 Vitals:   01/01/24 1233  BP: (!) 138/92  Pulse: (!) 104  Resp: 16  Temp: 99 F (37.2 C)  SpO2: 93%     FHT: NA  Lab orders placed from triage: UPT

## 2024-01-01 NOTE — MAU Provider Note (Signed)
 S/HPI  Ms. Veronica Moyer is a 28 y.o. G1P0010 patient who presents to MAU today  from ED with complaint of she is not currently pregnant but is reporting that she has been having heavy vaginal bleeding and cramping since Wednesday 12/10.  Patient was seen and evaluated in the ED at [facility] on 12/27/2023.  Patient had a complete workup at that time including a GYN ultrasound with transvaginal as well as a CT scan of the abdomen and pelvis which showed no acute pathology.  Patient has a history of PCOS and reports she has not been seen by GYN since 2023.  Patient states she has a history of amenorrhea due to the PCOS but her last 2 periods were normal and then this recent episode/period has been extremely heavy and painful x 7 days.  Patient was given  Megace  20 mg twice daily and Percocet for pain control and given a stat referral for GYN follow-up.   O BP 120/70 (BP Location: Right Arm)   Pulse 96   Temp 98.9 F (37.2 C) (Oral)   Resp 18   Ht 5' 2 (1.575 m)   Wt 120 kg   LMP 12/25/2023 (Exact Date)   SpO2 98%   BMI 48.40 kg/m  Physical Exam Vitals and nursing note reviewed.  Constitutional:      General: She is not in acute distress.    Appearance: She is well-developed. She is obese. She is not ill-appearing.  HENT:     Head: Normocephalic.     Nose: Nose normal.  Cardiovascular:     Rate and Rhythm: Normal rate.  Pulmonary:     Effort: Pulmonary effort is normal.  Abdominal:     Palpations: Abdomen is soft.     Tenderness: There is no abdominal tenderness.  Genitourinary:    Vagina: No foreign body. Bleeding present. No vaginal discharge.     Cervix: No cervical motion tenderness or discharge.     Uterus: Normal.   Musculoskeletal:        General: Normal range of motion.     Cervical back: Normal range of motion.  Skin:    General: Skin is warm.  Neurological:     Mental Status: She is alert and oriented to person, place, and time.  Psychiatric:         Behavior: Behavior normal.    Speculum exam chaperoned by Jeoffrey Ade, RN No visual lesions, small  amount of brownish blood with clots manually removed from the vaginal vault (2 swabs) , cervix visually closed with mild oozing of blood from the OS,  Negative CMT ( Vaginal cx obtained)  @ 1816 patient still c/o pain not relieved by oxycodone  10 mg given. Will order Toradol  IM x 1 and plan to discharge home on increased dose of Magace 20 mg TID x 10 days to resolve bleeding. (Evidence-based reference from up-to-date on hemodynamically stable reproductive age patients with acute uterine bleeding initial management algorithm 2025)   RX sent for Toradol  and oxycodone  for pain relief as previously discussed with Dr Elvera [facility] MAU Attending)   MDM  HIGH  Chart reviewed including records from emergency room at [facility] on 12/27/2023 Imaging reviewed from 12/27/2023 Physical exam with pelvic CBC: NM, Hgb 12.4 CMP: NM ( No electrolyte imbalance noted) UA: Large blood otherwise no evidence of infection Vaginal cultures ( Swabs obtained ) Wet Prep and GC/Chlamydia  UPT Negative   Dw DR Izell at 1600  Will get follow-up  pelvic/transvaginal ultrasound ( Radiology called)  Based on findings we will increase dose of Megace  20 mg TID until bleeding resolves and able to follow up with GYN - Message sent to the office for appointment   @ 1816 patient still c/o pain not relieved by oxycodone  10 mg given. Will order Toradol  60 mg IM x 1 and plan to discharge home on increased dose of Magace 20 mg TID x 10 days to resolve bleeding. RX sent for Toradol  10 mg PO  and oxycodone  10 mg q6 prn severe pain  relief as previously discussed with Dr Izell [facility] MAU Attending)   Orders Placed This Encounter  Procedures   Wet prep, genital    Standing Status:   Standing    Number of Occurrences:   1   US  PELVIS TRANSVAGINAL NON-OB (TV ONLY)    Not pregnant heavy VB with H/O  PCOS    Standing Status:    Standing    Number of Occurrences:   1    Symptom/Reason for Exam:   Vaginal bleeding [[id number]]   CBC with Differential    Standing Status:   Standing    Number of Occurrences:   1   Comprehensive metabolic panel    Standing Status:   Standing    Number of Occurrences:   1   Lipase, blood    Standing Status:   Standing    Number of Occurrences:   1   Urinalysis, Routine w reflex microscopic -Urine, Clean Catch    Standing Status:   Standing    Number of Occurrences:   1    Obtain urine by in and out catheter if not obtained within 30 minutes of placing in a treatment room?:   Yes    Specimen Source:   Urine, Clean Catch [76]   POC Urine Pregnancy, ED (not at [facility] or [facility])    Standing Status:   Standing    Number of Occurrences:   1   Pregnancy, urine POC    Standing Status:   Standing    Number of Occurrences:   1   Insert peripheral IV    Standing Status:   Standing    Number of Occurrences:   1   Discharge patient Discharge disposition: 01-Home or Self Care; Discharge patient date: 01/01/2024    Standing Status:   Standing    Number of Occurrences:   1    Discharge disposition:   01-Home or Self Care [1]    Discharge patient date:   01/01/2024      Results for orders placed or performed during the hospital encounter of 01/01/24 (from the past 24 hours)  CBC with Differential     Status: Abnormal   Collection Time: 01/01/24  1:14 PM  Result Value Ref Range   WBC 10.5 4.0 - 10.5 K/uL   RBC 3.75 (L) 3.87 - 5.11 MIL/uL   Hemoglobin 12.4 12.0 - 15.0 g/dL   HCT 63.9 63.9 - 53.9 %   MCV 96.0 80.0 - 100.0 fL   MCH 33.1 26.0 - 34.0 pg   MCHC 34.4 30.0 - 36.0 g/dL   RDW 87.8 88.4 - 84.4 %   Platelets 453 (H) 150 - 400 K/uL   nRBC 0.0 0.0 - 0.2 %   Neutrophils Relative % 73 %   Neutro Abs 7.7 1.7 - 7.7 K/uL   Lymphocytes Relative 22 %   Lymphs Abs 2.3 0.7 - 4.0 K/uL   Monocytes Relative 3 %  Monocytes Absolute 0.3 0.1 - 1.0 K/uL   Eosinophils Relative 1 %   Eosinophils  Absolute 0.1 0.0 - 0.5 K/uL   Basophils Relative 1 %   Basophils Absolute 0.1 0.0 - 0.1 K/uL   Immature Granulocytes 0 %   Abs Immature Granulocytes 0.04 0.00 - 0.07 K/uL  Comprehensive metabolic panel     Status: Abnormal   Collection Time: 01/01/24  1:14 PM  Result Value Ref Range   Sodium 137 135 - 145 mmol/L   Potassium 3.8 3.5 - 5.1 mmol/L   Chloride 103 98 - 111 mmol/L   CO2 24 22 - 32 mmol/L   Glucose, Bld 141 (H) 70 - 99 mg/dL   BUN 6 6 - 20 mg/dL   Creatinine, Ser 9.35 0.44 - 1.00 mg/dL   Calcium 9.5 8.9 - 89.6 mg/dL   Total Protein 7.4 6.5 - 8.1 g/dL   Albumin 4.2 3.5 - 5.0 g/dL   AST 22 15 - 41 U/L   ALT 20 0 - 44 U/L   Alkaline Phosphatase 109 38 - 126 U/L   Total Bilirubin 0.3 0.0 - 1.2 mg/dL   GFR, Estimated >39 >39 mL/min   Anion gap 11 5 - 15  Lipase, blood     Status: None   Collection Time: 01/01/24  1:14 PM  Result Value Ref Range   Lipase 23 11 - 51 U/L  Urinalysis, Routine w reflex microscopic -Urine, Clean Catch     Status: Abnormal   Collection Time: 01/01/24  3:14 PM  Result Value Ref Range   Color, Urine YELLOW YELLOW   APPearance HAZY (A) CLEAR   Specific Gravity, Urine 1.021 1.005 - 1.030   pH 6.0 5.0 - 8.0   Glucose, UA NEGATIVE NEGATIVE mg/dL   Hgb urine dipstick LARGE (A) NEGATIVE   Bilirubin Urine NEGATIVE NEGATIVE   Ketones, ur NEGATIVE NEGATIVE mg/dL   Protein, ur 30 (A) NEGATIVE mg/dL   Nitrite NEGATIVE NEGATIVE   Leukocytes,Ua NEGATIVE NEGATIVE   RBC / HPF >50 0 - 5 RBC/hpf   WBC, UA 0-5 0 - 5 WBC/hpf   Bacteria, UA RARE (A) NONE SEEN   Squamous Epithelial / HPF 0-5 0 - 5 /HPF   Mucus PRESENT   Pregnancy, urine POC     Status: None   Collection Time: 01/01/24  3:29 PM  Result Value Ref Range   Preg Test, Ur NEGATIVE NEGATIVE  Wet prep, genital     Status: None   Collection Time: 01/01/24  6:11 PM   Specimen: PATH Cytology Cervicovaginal Ancillary Only  Result Value Ref Range   Yeast Wet Prep HPF POC NONE SEEN NONE SEEN    Trich, Wet Prep NONE SEEN NONE SEEN   Clue Cells Wet Prep HPF POC NONE SEEN NONE SEEN   WBC, Wet Prep HPF POC <10 <10   Sperm NONE SEEN      I have reviewed the patient chart and performed the physical exam . I have ordered & interpreted the lab results and reviewed and interpreted the ultrasound images.  Endometrial lining appears to be thinned out at 3.24 mm and no obvious appearance of cystic mass.  Final radiology report pending Medications ordered as stated above/below.  A/P as described below.  Counseling and education provided and patient agreeable  with plan as described below. Verbalized understanding.    ASSESSMENT Medical screening exam complete  Not currently pregnant  Vaginal bleeding  Abdominal cramping  PCOS (polycystic ovarian syndrome)  BMI 45.0-49.9,  adult [facility])    PLAN  Discharge from MAU in stable condition  Patient is hemodynamically stable and not actively bleeding   Message sent  urgent GYN appointment  See AVS for full description of educational information and instructions provided to the patient at time of discharge  List of options for follow-up given   Warning signs for worsening condition that would warrant emergency follow-up discussed  Patient may return to MAU as needed   Allergies as of 01/01/2024       Reactions   Wound Dressing Adhesive Rash        Medication List     STOP taking these medications    oxyCODONE -acetaminophen  5-325 MG tablet Commonly known as: PERCOCET/ROXICET       TAKE these medications    ketorolac  10 MG tablet Commonly known as: TORADOL  Take 1 tablet (10 mg total) by mouth every 6 (six) hours as needed for moderate pain (pain score 4-6) or severe pain (pain score 7-10).   megestrol  20 MG tablet Commonly known as: MEGACE  Take 1 tablet (20 mg total) by mouth 3 (three) times daily for 10 days. What changed: when to take this   oxyCODONE  5 MG immediate release tablet Commonly known as: Oxy  IR/ROXICODONE  Take 2 tablets (10 mg total) by mouth every 6 (six) hours as needed for up to 3 days for severe pain (pain score 7-10).         Littie Olam LABOR, NP 01/01/2024 6:35 PM   This chart was dictated using voice recognition software, Dragon. Despite the best efforts of this provider to proofread and correct errors, errors may still occur which can change documentation meaning.

## 2024-01-01 NOTE — Discharge Instructions (Signed)
[place] for [employer] at [employer] [facility]    Phone: [place] for [employer] at [place]   Phone: [place] for [employer] at [place]  Phone: [place] for [employer] at [employer]  Phone: [place] for [employer] at [place]  Phone: [place] for [place] at [facility]   Phone: [facility]       Phone: [phone]  [facility]    Phone: [phone]   [facility]    Phone: [phone]  [facility]    Phone: [place]    Phone: [phone]  [place] [facility]       Phone: [phone]   [place] [facility]  Phone: [place]    Phone: [phone]  [facility]   Phone: [phone]  [facility]      Phone: [phone]  [facility]    Phone: [phone]

## 2024-01-02 LAB — GC/CHLAMYDIA PROBE AMP (~~LOC~~) NOT AT ARMC
Chlamydia: NEGATIVE
Comment: NEGATIVE
Comment: NORMAL
Neisseria Gonorrhea: NEGATIVE

## 2024-01-17 ENCOUNTER — Telehealth: Payer: Self-pay | Admitting: Family Medicine

## 2024-01-17 NOTE — Telephone Encounter (Signed)
 Called patient and left detailed message about upcoming appt. Will also mail appt reminder.

## 2024-02-24 ENCOUNTER — Encounter: Payer: Self-pay | Admitting: Obstetrics and Gynecology
